# Patient Record
Sex: Male | Born: 1975 | Race: White | Hispanic: No | Marital: Married | Smoking: Never smoker
Health system: Southern US, Community
[De-identification: ages and names within clinical notes are randomized; demographics above are authoritative.]

## PROBLEM LIST (undated history)

## (undated) DIAGNOSIS — I10 Essential (primary) hypertension: Secondary | ICD-10-CM

## (undated) DIAGNOSIS — K219 Gastro-esophageal reflux disease without esophagitis: Secondary | ICD-10-CM

## (undated) DIAGNOSIS — T7840XA Allergy, unspecified, initial encounter: Secondary | ICD-10-CM

## (undated) DIAGNOSIS — G43909 Migraine, unspecified, not intractable, without status migrainosus: Secondary | ICD-10-CM

## (undated) HISTORY — DX: Essential (primary) hypertension: I10

## (undated) HISTORY — PX: WISDOM TOOTH EXTRACTION: SHX21

## (undated) HISTORY — DX: Gastro-esophageal reflux disease without esophagitis: K21.9

## (undated) HISTORY — DX: Allergy, unspecified, initial encounter: T78.40XA

## (undated) HISTORY — PX: UPPER GASTROINTESTINAL ENDOSCOPY: SHX188

## (undated) SURGERY — EGD (ESOPHAGOGASTRODUODENOSCOPY)
Anesthesia: Monitor Anesthesia Care

---

## 2010-09-20 ENCOUNTER — Emergency Department (HOSPITAL_COMMUNITY): Admission: EM | Admit: 2010-09-20 | Discharge: 2010-09-20 | Payer: Self-pay | Admitting: Emergency Medicine

## 2011-02-26 LAB — DIFFERENTIAL
Lymphocytes Relative: 14 % (ref 12–46)
Lymphs Abs: 1.2 10*3/uL (ref 0.7–4.0)
Monocytes Relative: 8 % (ref 3–12)
Neutro Abs: 6.8 10*3/uL (ref 1.7–7.7)
Neutrophils Relative %: 75 % (ref 43–77)

## 2011-02-26 LAB — URINALYSIS, ROUTINE W REFLEX MICROSCOPIC
Bilirubin Urine: NEGATIVE
Hgb urine dipstick: NEGATIVE
Specific Gravity, Urine: 1.012 (ref 1.005–1.030)
pH: 7.5 (ref 5.0–8.0)

## 2011-02-26 LAB — POCT I-STAT, CHEM 8
BUN: 13 mg/dL (ref 6–23)
Chloride: 103 mEq/L (ref 96–112)
Creatinine, Ser: 1.2 mg/dL (ref 0.4–1.5)
Glucose, Bld: 99 mg/dL (ref 70–99)
Potassium: 4.8 mEq/L (ref 3.5–5.1)
Sodium: 137 mEq/L (ref 135–145)

## 2011-02-26 LAB — CBC
Hemoglobin: 17.1 g/dL — ABNORMAL HIGH (ref 13.0–17.0)
MCH: 32.5 pg (ref 26.0–34.0)
RBC: 5.26 MIL/uL (ref 4.22–5.81)
WBC: 9 10*3/uL (ref 4.0–10.5)

## 2014-07-20 ENCOUNTER — Emergency Department (HOSPITAL_COMMUNITY)
Admission: EM | Admit: 2014-07-20 | Discharge: 2014-07-20 | Disposition: A | Discharge status: Home / Self Care | Payer: PRIVATE HEALTH INSURANCE | Attending: Emergency Medicine | Admitting: Emergency Medicine

## 2014-07-20 ENCOUNTER — Encounter (HOSPITAL_COMMUNITY): Payer: Self-pay | Admitting: Emergency Medicine

## 2014-07-20 DIAGNOSIS — IMO0002 Reserved for concepts with insufficient information to code with codable children: Secondary | ICD-10-CM | POA: Insufficient documentation

## 2014-07-20 DIAGNOSIS — Z23 Encounter for immunization: Secondary | ICD-10-CM | POA: Insufficient documentation

## 2014-07-20 DIAGNOSIS — Y9289 Other specified places as the place of occurrence of the external cause: Secondary | ICD-10-CM | POA: Insufficient documentation

## 2014-07-20 DIAGNOSIS — Y939 Activity, unspecified: Secondary | ICD-10-CM | POA: Insufficient documentation

## 2014-07-20 DIAGNOSIS — W540XXA Bitten by dog, initial encounter: Secondary | ICD-10-CM | POA: Insufficient documentation

## 2014-07-20 DIAGNOSIS — Z8669 Personal history of other diseases of the nervous system and sense organs: Secondary | ICD-10-CM | POA: Insufficient documentation

## 2014-07-20 HISTORY — DX: Migraine, unspecified, not intractable, without status migrainosus: G43.909

## 2014-07-20 MED ORDER — RABIES IMMUNE GLOBULIN 150 UNIT/ML IM INJ
20.0000 [IU]/kg | INJECTION | Freq: Once | INTRAMUSCULAR | Status: AC
  Administered 2014-07-20: 1575 [IU] via INTRAMUSCULAR
  Filled 2014-07-20: qty 10.5

## 2014-07-20 MED ORDER — TETANUS-DIPHTH-ACELL PERTUSSIS 5-2.5-18.5 LF-MCG/0.5 IM SUSP
0.5000 mL | Freq: Once | INTRAMUSCULAR | Status: AC
  Administered 2014-07-20: 0.5 mL via INTRAMUSCULAR
  Filled 2014-07-20: qty 0.5

## 2014-07-20 MED ORDER — RABIES VACCINE, PCEC IM SUSR
1.0000 mL | Freq: Once | INTRAMUSCULAR | Status: AC
  Administered 2014-07-20: 1 mL via INTRAMUSCULAR
  Filled 2014-07-20: qty 1

## 2014-07-20 NOTE — ED Notes (Signed)
Pt coming from home with a dog bite from June 29, 2014 with the recommendation that he should get a rabies vaccine since he was unable to track down the history of the dog that bit him.  Pt was bit on the outer aspect of the left leg.  The skin is intact, no bleeding.

## 2014-07-20 NOTE — ED Notes (Signed)
Dr. Fredderick PhenixBelfi at bedside assessing pt.

## 2014-07-20 NOTE — Discharge Instructions (Signed)
Human Bite  Human bite wounds tend to become infected, even when they seem minor at first. Bite wounds of the hand can be serious because the tendons and joints are close to the skin. Infection can develop very rapidly, even in a matter of hours.   DIAGNOSIS   Your caregiver will most likely:   Take a detailed history of the bite injury.   Perform a wound exam.   Take your medical history.  Blood tests or X-rays may be performed. Sometimes, infected bite wounds are cultured and sent to a lab to identify the infectious bacteria.  TREATMENT   Medical treatment will depend on the location of the bite as well as the patient's medical history. Treatment may include:   Wound care, such as cleaning and flushing the wound with saline solution, bandaging, and elevating the affected area.   Antibiotic medicine.   Tetanus immunization.   Leaving the wound open to heal. This is often done with human bites due to the high risk of infection. However, in certain cases, wound closure with stitches, wound adhesive, skin adhesive strips, or staples may be used.  Infected bites that are left untreated may require intravenous (IV) antibiotics and surgical treatment in the hospital.  HOME CARE INSTRUCTIONS   Follow your caregiver's instructions for wound care.   Take all medicines as directed.   If your caregiver prescribes antibiotics, take them as directed. Finish them even if you start to feel better.   Follow up with your caregiver for further exams or immunizations as directed.  You may need a tetanus shot if:   You cannot remember when you had your last tetanus shot.   You have never had a tetanus shot.   The injury broke your skin.  If you get a tetanus shot, your arm may swell, get red, and feel warm to the touch. This is common and not a problem. If you need a tetanus shot and you choose not to have one, there is a rare chance of getting tetanus. Sickness from tetanus can be serious.  SEEK IMMEDIATE MEDICAL CARE  IF:   You have increased pain, swelling, or redness around the bite wound.   You have chills.   You have a fever.   You have pus draining from the wound.   You have red streaks on the skin coming from the wound.   You have pain with movement or trouble moving the injured part.   You are not improving, or you are getting worse.   You have any other questions or concerns.  MAKE SURE YOU:   Understand these instructions.   Will watch your condition.   Will get help right away if you are not doing well or get worse.  Document Released: 01/07/2005 Document Revised: 02/22/2012 Document Reviewed: 07/22/2011  Exit[employer] Patient Information 2015 [employer]. This information is not intended to replace advice given to you by your health care provider. Make sure you discuss any questions you have with your health care provider.

## 2014-07-20 NOTE — ED Provider Notes (Signed)
CSN: [id number]     Arrival date & time 07/20/14  0810 History   First MD Initiated Contact with Patient 07/20/14 [phone]     Chief Complaint  Patient presents with  . Animal Bite     (Consider location/radiation/quality/duration/timing/severity/associated sxs/prior Treatment) HPI Comments: Patient presents for rabies vaccine. He states he was bitten by a dog on July 17. He does not know who the owner of the dog was, and animal control was unable to locate the dog. He states he remembered last night that animal control advised him to start rabies vaccines. He's never been vaccinated in the past. He says his dog bite as well healing. He denies any issues with that. He denies any cough shortness of breath or other recent illnesses. He's not sure when his last tetanus shot was.  Patient is a 38 y.o. male presenting with animal bite.  Animal Bite Associated symptoms: no fever and no numbness     Past Medical History  Diagnosis Date  . Migraines    Past Surgical History  Procedure Laterality Date  . Wisdom tooth extraction     No family history on file. History  Substance Use Topics  . Smoking status: Never Smoker   . Smokeless tobacco: Not on file  . Alcohol Use: Yes     Comment: occasional    Review of Systems  Constitutional: Negative for fever.  Respiratory: Negative for cough and shortness of breath.   Gastrointestinal: Negative for nausea and vomiting.  Musculoskeletal: Negative for arthralgias.  Skin: Positive for wound.  Neurological: Negative for weakness and numbness.      Allergies  Review of patient's allergies indicates no known allergies.  Home Medications   Prior to Admission medications   Medication Sig Start Date End Date Taking? Authorizing Provider  ibuprofen (ADVIL,MOTRIN) 200 MG tablet Take 400 mg by mouth every 6 (six) hours as needed for headache or moderate pain.   Yes Historical Provider, MD   Pulse 59  Temp(Src) 98 F (36.7 C)  Resp 17  Ht 5'  9" (1.753 m)  Wt 174 lb (78.926 kg)  BMI 25.68 kg/m2  SpO2 98% Physical Exam  Constitutional: He appears well-developed and well-nourished.  Cardiovascular: Normal rate.   Pulmonary/Chest: Effort normal.  Musculoskeletal:  Small healing abrasion to his left ankle. It's well healing without signs of infection. There is no underlying joint tenderness or bony tenderness.  NVI  Skin: Skin is warm and dry.    ED Course  Procedures (including critical care time) Labs Review Labs Reviewed - No data to display  Imaging Review No results found.   EKG Interpretation None      MDM   Final diagnoses:  Dog bite    Rabies vaccine series was started. His tetanus shot was updated. He is advised to return as needed for worsening symptoms. He's been encouraged to go to urgent care Center during daytime hours to have his rabies vaccines.    Rolan BuccoMelanie Tzipora Mcinroy, MD 07/20/14 [phone]

## 2014-07-23 ENCOUNTER — Encounter (HOSPITAL_COMMUNITY): Payer: Self-pay | Admitting: Emergency Medicine

## 2014-07-23 ENCOUNTER — Emergency Department (INDEPENDENT_AMBULATORY_CARE_PROVIDER_SITE_OTHER)
Admission: EM | Admit: 2014-07-23 | Discharge: 2014-07-23 | Disposition: A | Discharge status: Home / Self Care | Payer: PRIVATE HEALTH INSURANCE | Source: Home / Self Care

## 2014-07-23 DIAGNOSIS — Z203 Contact with and (suspected) exposure to rabies: Secondary | ICD-10-CM

## 2014-07-23 MED ORDER — RABIES VACCINE, PCEC IM SUSR
INTRAMUSCULAR | Status: AC
  Filled 2014-07-23: qty 1

## 2014-07-23 MED ORDER — RABIES VACCINE, PCEC IM SUSR
1.0000 mL | Freq: Once | INTRAMUSCULAR | Status: AC
  Administered 2014-07-23: 1 mL via INTRAMUSCULAR

## 2014-07-23 NOTE — Discharge Instructions (Signed)
Return as instructed by ed staff.  Return sooner if any concerns

## 2014-07-23 NOTE — ED Notes (Signed)
Patient bit by a dog on 7/17.  Patient does not know dog, owner, or shot history.  Seen in ed on 8/7 and rabies series initiated.  This is second rabies injection.  No complaints today.

## 2014-07-28 ENCOUNTER — Encounter (HOSPITAL_COMMUNITY): Payer: Self-pay | Admitting: Emergency Medicine

## 2014-07-28 ENCOUNTER — Emergency Department (INDEPENDENT_AMBULATORY_CARE_PROVIDER_SITE_OTHER)
Admission: EM | Admit: 2014-07-28 | Discharge: 2014-07-28 | Disposition: A | Discharge status: Home / Self Care | Payer: PRIVATE HEALTH INSURANCE | Source: Home / Self Care

## 2014-07-28 DIAGNOSIS — Z203 Contact with and (suspected) exposure to rabies: Secondary | ICD-10-CM

## 2014-07-28 MED ORDER — RABIES VACCINE, PCEC IM SUSR
INTRAMUSCULAR | Status: AC
  Filled 2014-07-28: qty 1

## 2014-07-28 MED ORDER — RABIES VACCINE, PCEC IM SUSR
1.0000 mL | Freq: Once | INTRAMUSCULAR | Status: AC
  Administered 2014-07-28: 1 mL via INTRAMUSCULAR

## 2014-07-28 NOTE — ED Notes (Signed)
Here for day #7 in series; to return in 1 week for next injection

## 2014-07-28 NOTE — Discharge Instructions (Signed)
Return to Urgent Care for remaining injections in the rabies series

## 2014-08-04 ENCOUNTER — Encounter (HOSPITAL_COMMUNITY): Payer: Self-pay | Admitting: Emergency Medicine

## 2014-08-04 ENCOUNTER — Emergency Department (INDEPENDENT_AMBULATORY_CARE_PROVIDER_SITE_OTHER)
Admission: EM | Admit: 2014-08-04 | Discharge: 2014-08-04 | Disposition: A | Discharge status: Home / Self Care | Payer: PRIVATE HEALTH INSURANCE | Source: Home / Self Care

## 2014-08-04 DIAGNOSIS — Z203 Contact with and (suspected) exposure to rabies: Secondary | ICD-10-CM

## 2014-08-04 MED ORDER — RABIES VACCINE, PCEC IM SUSR
1.0000 mL | Freq: Once | INTRAMUSCULAR | Status: AC
  Administered 2014-08-04: 1 mL via INTRAMUSCULAR

## 2014-08-04 MED ORDER — RABIES VACCINE, PCEC IM SUSR
INTRAMUSCULAR | Status: AC
  Filled 2014-08-04: qty 1

## 2014-08-04 NOTE — Discharge Instructions (Signed)
Follow up as needed

## 2014-08-04 NOTE — ED Notes (Signed)
Rabies injection 

## 2017-10-15 ENCOUNTER — Telehealth (HOSPITAL_COMMUNITY): Payer: Self-pay | Admitting: Vascular Surgery

## 2017-10-15 NOTE — Telephone Encounter (Signed)
Left pt message giving pt appt day and time w. Db, asked pt to call back to confirm

## 2017-10-18 ENCOUNTER — Ambulatory Visit (HOSPITAL_COMMUNITY)
Admission: RE | Admit: 2017-10-18 | Discharge: 2017-10-18 | Disposition: A | Discharge status: Home / Self Care | Payer: PRIVATE HEALTH INSURANCE | Source: Ambulatory Visit | Attending: Internal Medicine | Admitting: Internal Medicine

## 2017-10-18 VITALS — BP 130/94 | HR 61 | Wt 181.0 lb

## 2017-10-18 DIAGNOSIS — I493 Ventricular premature depolarization: Secondary | ICD-10-CM | POA: Insufficient documentation

## 2017-10-18 DIAGNOSIS — I10 Essential (primary) hypertension: Secondary | ICD-10-CM | POA: Diagnosis not present

## 2017-10-18 DIAGNOSIS — Z79899 Other long term (current) drug therapy: Secondary | ICD-10-CM | POA: Insufficient documentation

## 2017-10-18 LAB — URINALYSIS, ROUTINE W REFLEX MICROSCOPIC
Bilirubin Urine: NEGATIVE
GLUCOSE, UA: NEGATIVE mg/dL
Hgb urine dipstick: NEGATIVE
KETONES UR: NEGATIVE mg/dL
LEUKOCYTES UA: NEGATIVE
NITRITE: NEGATIVE
PROTEIN: NEGATIVE mg/dL
Specific Gravity, Urine: 1.009 (ref 1.005–1.030)
pH: 6 (ref 5.0–8.0)

## 2017-10-18 NOTE — Progress Notes (Signed)
CARDIOLOGY CLINIC CONSULT NOTE  Referring Physician: K. Reche Dixonalbot   HPI:  Brandon [name] is a 41 y/o C[employer] for [employer] with a h/o migraines and borderline HTN. Referred by Dr. Jeronimo GreavesKyle Talbot for further evaluation of his HTN.   In 2010 was told he had high blood pressure. He subsequently lost 30 pounds by mountain biking and running. BP got better but never got back to normal. Has very strong FHx of HTN in both parents.   Father has had extensive w/u for HTN but no clear cause.   Cycles very competitively and does endurance events with a high-level of performance over many hours. Sweat profusely. No CP or SOB. BPs have been running 145-150/85-90. Only takes BP about once a month. Not taking any meds currently. Rides 6-10 hours per week on the mountain bike.  Eats relatively healthy. Does not drink a lot of caffeine. Does not routinely take supplements or herbals. No snoring.      Review of Systems: [y] = yes, [ ]  = no   General: Weight gain [ ] ; Weight loss [ ] ; Anorexia [ ] ; Fatigue [ ] ; Fever [ ] ; Chills [ ] ; Weakness [ ]   Cardiac: Chest pain/pressure [ ] ; Resting SOB [ ] ; Exertional SOB [ ] ; Orthopnea [ ] ; Pedal Edema [ ] ; Palpitations [ ] ; Syncope [ ] ; Presyncope [ ] ; Paroxysmal nocturnal dyspnea[ ]   Pulmonary: Cough [ ] ; Wheezing[ ] ; Hemoptysis[ ] ; Sputum [ ] ; Snoring [ ]   GI: Vomiting[ ] ; Dysphagia[ ] ; Melena[ ] ; Hematochezia [ ] ; Heartburn[ ] ; Abdominal pain [ ] ; Constipation [ ] ; Diarrhea [ ] ; BRBPR [ ]   GU: Hematuria[ ] ; Dysuria [ ] ; Nocturia[ ]   Vascular: Pain in legs with walking [ ] ; Pain in feet with lying flat [ ] ; Non-healing sores [ ] ; Stroke [ ] ; TIA [ ] ; Slurred speech [ ] ;  Neuro: Headaches[ ] ; Vertigo[ ] ; Seizures[ ] ; Paresthesias[ ] ;Blurred vision [ ] ; Diplopia [ ] ; Vision changes [ ]   Ortho/Skin: Arthritis [ ] ; Joint pain [ ] ; Muscle pain [ ] ; Joint swelling [ ] ; Back Pain [ ] ; Rash [ ]   Psych: Depression[ ] ; Anxiety[ ]   Heme: Bleeding problems [ ] ; Clotting disorders  [ ] ; Anemia [ ]   Endocrine: Diabetes [ ] ; Thyroid dysfunction[ ]    Past Medical History:  Diagnosis Date  . Migraines     Current Outpatient Medications  Medication Sig Dispense Refill  . ibuprofen (ADVIL,MOTRIN) 200 MG tablet Take 400 mg by mouth every 6 (six) hours as needed for headache or moderate pain.     No current facility-administered medications for this encounter.     No Known Allergies    Social History   Socioeconomic History  . Marital status: Married    Spouse name: Not on file  . Number of children: Not on file  . Years of education: Not on file  . Highest education level: Not on file  Social Needs  . Financial resource strain: Not on file  . Food insecurity - worry: Not on file  . Food insecurity - inability: Not on file  . Transportation needs - medical: Not on file  . Transportation needs - non-medical: Not on file  Occupational History  . Not on file  Tobacco Use  . Smoking status: Never Smoker  Substance and Sexual Activity  . Alcohol use: Yes    Comment: occasional  . Drug use: No  . Sexual activity: Not on file  Other Topics Concern  . Not on file  Social History Narrative  . Not on file   FHX: Mom HTN Dad HTN and Afib/AFL s/p ablation Only child MGMs both with HTN MGF died MI  Vitals:   Oct 29, 2017 1212  BP: (!) 130/94  Pulse: 61  SpO2: 99%  Weight: 181 lb (82.1 kg)    PHYSICAL EXAM: General:  Fit appearing. No respiratory difficulty HEENT: normal Neck: supple. no JVD. Carotids 2+ bilat; no bruits. No lymphadenopathy or thryomegaly appreciated. Cor: PMI nondisplaced. Huston Foley regular . No rubs, gallops or murmurs. Lungs: clear Abdomen: soft, nontender, nondistended. No hepatosplenomegaly. No bruits or masses. Good bowel sounds. Extremities: no cyanosis, clubbing, rash, edema Neuro: alert & oriented x 3, cranial nerves grossly intact. moves all 4 extremities w/o difficulty. Affect pleasant.  ECG:  Sinus brady 41 with frequent  PVCs/junctional beats in a bigeminal pattern. 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 Have asked him to take his BP at least 2x/day at different times throughout the day (including post exercise) to trend BP.  - He will likely need anti-HTN agent. With high-level exercise performance and prolonged events will avoid b-blockers and diuretics - Likely will start losartan 25,mg daily depending on BP log - Diet and exercise already quite good - UA done in clinic without protein. - No evidence of OSA - No renal bruits on exam  2) Frequent PVCs on ECG - This is asymptomatic. Suspect he may have a junctional rhythm that competes with his slow sinus rate.  - Will check echo - I will d/w EP. May need 48-hour monitor.   Arvilla Meres, MD  8:59 PM

## 2017-10-18 NOTE — Patient Instructions (Signed)
Please keep a log of your blood pressure readings for 2 weeks and e-mail your findings to Dr.Bensimhon.  Follow up with Dr.Bensimhon in 3-4 months.

## 2017-12-23 ENCOUNTER — Telehealth (HOSPITAL_COMMUNITY): Payer: Self-pay | Admitting: *Deleted

## 2017-12-23 MED ORDER — LOSARTAN POTASSIUM 25 MG PO TABS: 25.0000 mg | ORAL_TABLET | Freq: Every day | ORAL | 3 refills | Status: DC

## 2017-12-23 NOTE — Telephone Encounter (Signed)
Pt emailed Dr Gala RomneyBensimhon his BP readings, per Dr Gala RomneyBensimhon start Losartan 25 mg daily, [facility]. That BP is too high. Let's start losartan 25 mg daily and see how it goes? Ok?   Brandon [name] - can you send script in for Brandon [name]?  Thanks. - dan     > On Dec 22, 2017, at 3:07 PM, [name]  wrote: >  > Hi Dan - Apparently I'm a terrible patient and an even worse BP logger. But, I was able to get the following measurements from different times of day and after a couple of tough workouts: >  > 11/6       10am     151/84 > 11/7         9am     159/91 > 11/8       8pm       142/90 > 11/9       10am     143/91 > 12/6       10am     159/90 > 12/7    8pm    162/97 (30 minutes post 1 hour long steady Zone 3 workout) > 12/9    9am    152/88 > 12/11    10am    155/92 > 12/18    12pm    151/91 > 12/19    3pm    150/90 > 12/19    9pm    164/96 (30 minutes post 45 minute Zone 5 intervals workout) > 12/31    10am    145/91 > 12/31    4pm    149/92

## 2018-12-28 ENCOUNTER — Other Ambulatory Visit (HOSPITAL_COMMUNITY): Payer: Self-pay | Admitting: Internal Medicine

## 2019-08-04 ENCOUNTER — Ambulatory Visit: Payer: PRIVATE HEALTH INSURANCE | Admitting: Family Medicine

## 2019-08-04 NOTE — Progress Notes (Deleted)
[name] [place], [place] [address] Phone: [phone] Subjective:    I'm seeing this patient by the request  of:  Brandon Ditch, MD   CC:   [id number]  Brandon [name] is a 43 y.o. male coming in with complaint of ***  Onset-  Location Duration-  Character- Aggravating factors- Reliving factors-  Therapies tried-  Severity-     Past Medical History:  Diagnosis Date  . Migraines    Past Surgical History:  Procedure Laterality Date  . WISDOM TOOTH EXTRACTION     Social History   Socioeconomic History  . Marital status: Married    Spouse name: Not on file  . Number of children: Not on file  . Years of education: Not on file  . Highest education level: Not on file  Occupational History  . Not on file  Social Needs  . Financial resource strain: Not on file  . Food insecurity    Worry: Not on file    Inability: Not on file  . Transportation needs    Medical: Not on file    Non-medical: Not on file  Tobacco Use  . Smoking status: Never Smoker  Substance and Sexual Activity  . Alcohol use: Yes    Comment: occasional  . Drug use: No  . Sexual activity: Not on file  Lifestyle  . Physical activity    Days per week: Not on file    Minutes per session: Not on file  . Stress: Not on file  Relationships  . Social Herbalist on phone: Not on file    Gets together: Not on file    Attends religious service: Not on file    Active member of club or organization: Not on file    Attends meetings of clubs or organizations: Not on file    Relationship status: Not on file  Other Topics Concern  . Not on file  Social History Narrative  . Not on file   No Known Allergies No family history on file.   Current Outpatient Medications (Cardiovascular):  .  losartan (COZAAR) 25 MG tablet, TAKE 1 TABLET BY MOUTH EVERY DAY   Current Outpatient Medications (Analgesics):  .  ibuprofen (ADVIL,MOTRIN) 200 MG  tablet, Take 400 mg by mouth every 6 (six) hours as needed for headache or moderate pain.      Past medical history, social, surgical and family history all reviewed in electronic medical record.  No pertanent information unless stated regarding to the chief complaint.   Review of Systems:  No headache, visual changes, nausea, vomiting, diarrhea, constipation, dizziness, abdominal pain, skin rash, fevers, chills, night sweats, weight loss, swollen lymph nodes, body aches, joint swelling, muscle aches, chest pain, shortness of breath, mood changes.   Objective  There were no vitals taken for this visit. Systems examined below as of    General: No apparent distress alert and oriented x3 mood and affect normal, dressed appropriately.  HEENT: Pupils equal, extraocular movements intact  Respiratory: Patient's speak in full sentences and does not appear short of breath  Cardiovascular: No lower extremity edema, non tender, no erythema  Skin: Warm dry intact with no signs of infection or rash on extremities or on axial skeleton.  Abdomen: Soft nontender  Neuro: Cranial nerves II through XII are intact, neurovascularly intact in all extremities with 2+ DTRs and 2+ pulses.  Lymph: No lymphadenopathy of posterior or anterior cervical chain or axillae  bilaterally.  Gait normal with good balance and coordination.  MSK:  Non tender with full range of motion and good stability and symmetric strength and tone of shoulders, elbows, wrist, hip, knee and ankles bilaterally.     Impression and Recommendations:     This case required medical decision making of moderate complexity. The above documentation has been reviewed and is accurate and complete Judi Saa, DO       Note: This dictation was prepared with Dragon dictation along with smaller phrase technology. Any transcriptional errors that result from this process are unintentional.

## 2019-08-07 NOTE — Progress Notes (Signed)
Brandon Brandon [name] [place], [place] [address] Phone: [phone] Subjective:   Brandon Brandon [name], am serving as a scribe for Brandon Brandon [name].  I'[name] seeing this patient by the request  of:  Brandon Ditch, MD   CC: Neck pain  [id number]  Brandon Brandon [name] is a 43 y.o. male coming in with complaint of neck and upper back pain. Pain for 9 months. Denies any radiating symptoms. Pain increases after mountain biking and occurs the next day. Is woken up in the middle of the night with pain. Left sided only. Does suffer from migraines.  Patient did have a history of a CT scan of the neck in 2011 that was independently visualized by me showing Brandon [name] significant bony abnormality.  Since then patient has not had any imaging.    Past Medical History:  Diagnosis Date  . Migraines    Past Surgical History:  Procedure Laterality Date  . WISDOM TOOTH EXTRACTION     Social History   Socioeconomic History  . Marital status: Married    Spouse name: Not on file  . Number of children: Not on file  . Years of education: Not on file  . Highest education level: Not on file  Occupational History  . Not on file  Social Needs  . Financial resource strain: Not on file  . Food insecurity    Worry: Not on file    Inability: Not on file  . Transportation needs    Medical: Not on file    Non-medical: Not on file  Tobacco Use  . Smoking status: Never Smoker  Substance and Sexual Activity  . Alcohol use: Yes    Comment: occasional  . Drug use: Brandon [name]  . Sexual activity: Not on file  Lifestyle  . Physical activity    Days per week: Not on file    Minutes per session: Not on file  . Stress: Not on file  Relationships  . Social Herbalist on phone: Not on file    Gets together: Not on file    Attends religious service: Not on file    Active member of club or organization: Not on file    Attends meetings of clubs or organizations: Not on file   Relationship status: Not on file  Other Topics Concern  . Not on file  Social History Narrative  . Not on file   Brandon [name] Known Allergies Brandon [name] family history on file.   Current Outpatient Medications (Cardiovascular):  .  losartan (COZAAR) 25 MG tablet, TAKE 1 TABLET BY MOUTH EVERY DAY   Current Outpatient Medications (Analgesics):  .  ibuprofen (ADVIL,MOTRIN) 200 MG tablet, Take 400 mg by mouth every 6 (six) hours as needed for headache or moderate pain.      Past medical history, social, surgical and family history all reviewed in electronic medical record.  Brandon [name] pertanent information unless stated regarding to the chief complaint.   Review of Systems:  Brandon [name] headache, visual changes, nausea, vomiting, diarrhea, constipation, dizziness, abdominal pain, skin rash, fevers, chills, night sweats, weight loss, swollen lymph nodes, body aches, joint swelling, muscle aches, chest pain, shortness of breath, mood changes.   Objective  There were Brandon [name] vitals taken for this visit. Systems examined below as of    General: Brandon [name] apparent distress alert and oriented x3 mood and affect normal, dressed appropriately.  HEENT: Pupils equal, extraocular movements intact  Respiratory: Patient's speak in full sentences and does  not appear short of breath  Cardiovascular: Brandon [name] lower extremity edema, non tender, Brandon [name] erythema  Skin: Warm dry intact with Brandon [name] signs of infection or rash on extremities or on axial skeleton.  Abdomen: Soft nontender  Neuro: Cranial nerves II through XII are intact, neurovascularly intact in all extremities with 2+ DTRs and 2+ pulses.  Lymph: Brandon [name] lymphadenopathy of posterior or anterior cervical chain or axillae bilaterally.  Gait normal with good balance and coordination.  MSK:  Non tender with full range of motion and good stability and symmetric strength and tone of shoulders, elbows, wrist, hip, knee and ankles bilaterally.  Patient is neck exam shows some mild loss of lordosis, patient  does have negative Spurling's sign.  5 out of 5 strength in the upper extremities bilaterally and deep tendon reflexes intact.  Mild decrease in sidebending to the right and left of 5 to 10 degrees bilaterally.  Patient does have some scapular dyskinesis noted with some inferior weakness of the scapulas bilaterally right greater than left  Osteopathic findings C7 flexed rotated and side bent left T5 extended rotated and side bent right inhaled rib      Impression and Recommendations:     This case required medical decision making of moderate complexity. The above documentation has been reviewed and is accurate and complete Brandon Brandon [name] , DO       Note: This dictation was prepared with Dragon dictation along with smaller phrase technology. Any transcriptional errors that result from this process are unintentional.

## 2019-08-08 ENCOUNTER — Ambulatory Visit (INDEPENDENT_AMBULATORY_CARE_PROVIDER_SITE_OTHER): Payer: BC Managed Care – PPO | Admitting: Family Medicine

## 2019-08-08 ENCOUNTER — Encounter: Payer: Self-pay | Admitting: Family Medicine

## 2019-08-08 ENCOUNTER — Ambulatory Visit (INDEPENDENT_AMBULATORY_CARE_PROVIDER_SITE_OTHER)
Admission: RE | Admit: 2019-08-08 | Discharge: 2019-08-08 | Disposition: A | Discharge status: Home / Self Care | Payer: BC Managed Care – PPO | Source: Ambulatory Visit | Attending: Family Medicine | Admitting: Family Medicine

## 2019-08-08 ENCOUNTER — Other Ambulatory Visit: Payer: Self-pay

## 2019-08-08 VITALS — BP 122/88 | HR 68 | Ht 69.0 in | Wt 186.0 lb

## 2019-08-08 DIAGNOSIS — M999 Biomechanical lesion, unspecified: Secondary | ICD-10-CM | POA: Diagnosis not present

## 2019-08-08 DIAGNOSIS — G2589 Other specified extrapyramidal and movement disorders: Secondary | ICD-10-CM | POA: Diagnosis not present

## 2019-08-08 DIAGNOSIS — M542 Cervicalgia: Secondary | ICD-10-CM

## 2019-08-08 MED ORDER — GABAPENTIN 100 MG PO CAPS: 200.0000 mg | ORAL_CAPSULE | Freq: Every day | ORAL | 0 refills | Status: DC

## 2019-08-08 NOTE — Assessment & Plan Note (Signed)
Decision today to treat with OMT was based on Physical Exam  After verbal consent patient was treated with HVLA, ME, FPR techniques in cervical, thoracic, rib areas  Patient tolerated the procedure well with improvement in symptoms  Patient given exercises, stretches and lifestyle modifications  See medications in patient instructions if given  Patient will follow up in 4-6 weeks 

## 2019-08-08 NOTE — Assessment & Plan Note (Signed)
Patient is hemodynamically hematemesis.  Discussed with patient in great length.  Patient will change position of implant, gabapentin and nitroglycerin, x-rays ordered today to further evaluate for any other bony abnormality that could be contributing.  Attempted osteopathic manipulation that I think will also be beneficial.  Patient will follow-up with me again in 4 to 6 weeks

## 2019-08-08 NOTE — Patient Instructions (Signed)
Good to see you.  Ice 20 minutes 2 times daily. Usually after activity and before bed. Exercises 3 times a week.  Gabapentin 200 mg as needed Raise handle bars on mountain bike Turmeric 500mg  daily  Vitamin D 2000 IU daily  See me again in 4-6 weeks

## 2019-09-08 ENCOUNTER — Ambulatory Visit (INDEPENDENT_AMBULATORY_CARE_PROVIDER_SITE_OTHER): Payer: BC Managed Care – PPO | Admitting: Family Medicine

## 2019-09-08 ENCOUNTER — Encounter: Payer: Self-pay | Admitting: Family Medicine

## 2019-09-08 ENCOUNTER — Other Ambulatory Visit: Payer: Self-pay

## 2019-09-08 VITALS — BP 118/82 | HR 63 | Ht 69.0 in | Wt 184.0 lb

## 2019-09-08 DIAGNOSIS — M999 Biomechanical lesion, unspecified: Secondary | ICD-10-CM

## 2019-09-08 DIAGNOSIS — G2589 Other specified extrapyramidal and movement disorders: Secondary | ICD-10-CM | POA: Diagnosis not present

## 2019-09-08 NOTE — Patient Instructions (Signed)
Try to go exercises Good luck on the mud  See me in 6 weeks

## 2019-09-08 NOTE — Assessment & Plan Note (Signed)
Continues to have some instability.  I believe the patient will do relatively well.  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  Patient follow-up with me again in 4 to 8 weeks.  Responds well to manipulation

## 2019-09-08 NOTE — Progress Notes (Signed)
[name] [place], [place] [address] Phone: [phone] Subjective:   Brandon [name], am serving as a scribe for Dr. Hulan Saas.    CC: Back pain follow-up  HMC:[id number]   08/08/2019 Patient is hemodynamically hematemesis.  Discussed with patient in great length.  Patient will change position of implant, gabapentin and nitroglycerin, x-rays ordered today to further evaluate for any other bony abnormality that could be contributing.  Attempted osteopathic manipulation that I think will also be beneficial.  Patient will follow-up with me again in 4 to 6 weeks  Update 09/08/2019 Brandon [name] is a 43 y.o. male coming in with complaint of upper back pain. Patient states that his back is improving slowly. Feels about the same as last visit. Is using supplements. Has been doing exercises.  Patient states that when he does more road biking he does not have as much pain as he does when he was mountain bikes. Patient did have x-rays of the neck at last exam.  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      Past Medical History:  Diagnosis Date  . Migraines    Past Surgical History:  Procedure Laterality Date  . WISDOM TOOTH EXTRACTION     Social History   Socioeconomic History  . Marital status: Married    Spouse name: Not on file  . Number of children: Not on file  . Years of education: Not on file  . Highest education level: Not on file  Occupational History  . Not on file  Social Needs  . Financial resource strain: Not on file  . Food insecurity    Worry: Not on file    Inability: Not on file  . Transportation needs    Medical: Not on file    Non-medical: Not on file  Tobacco Use  . Smoking status: Never Smoker  Substance and Sexual Activity  . Alcohol use: Yes    Comment: occasional  . Drug use: [name]  . Sexual activity: Not on file   Lifestyle  . Physical activity    Days per week: Not on file    Minutes per session: Not on file  . Stress: Not on file  Relationships  . Social Herbalist on phone: Not on file    Gets together: Not on file    Attends religious service: Not on file    Active member of club or organization: Not on file    Attends meetings of clubs or organizations: Not on file    Relationship status: Not on file  Other Topics Concern  . Not on file  Social History Narrative  . Not on file   [name] Known Allergies [name] family history on file.   Current Outpatient Medications (Cardiovascular):  .  losartan (COZAAR) 25 MG tablet, TAKE 1 TABLET BY MOUTH EVERY DAY   Current Outpatient Medications (Analgesics):  .  ibuprofen (ADVIL,MOTRIN) 200 MG tablet, Take 400 mg by mouth every 6 (six) hours as needed for headache or moderate pain.   Current Outpatient Medications (Other):  .  gabapentin (NEURONTIN) 100 MG capsule, Take 2 capsules (200 mg total) by mouth at bedtime.    Past medical history, social, surgical and family history all reviewed in electronic medical record.  [name] pertanent information unless stated regarding to the chief complaint.   Review of Systems:  [name] headache, visual changes, nausea,  vomiting, diarrhea, constipation, dizziness, abdominal pain, skin rash, fevers, chills, night sweats, weight loss, swollen lymph nodes, body aches, joint swelling, muscle aches, chest pain, shortness of breath, mood changes.   Objective  Blood pressure 118/82, pulse 63, height 5\' 9"  (1.753 m), weight 184 lb (83.5 kg), SpO2 98 %.     General: [name] apparent distress alert and oriented x3 mood and affect normal, dressed appropriately.  HEENT: Pupils equal, extraocular movements intact  Respiratory: Patient's speak in full sentences and does not appear short of breath  Cardiovascular: [name] lower extremity edema, non tender, [name] erythema  Skin: Warm dry intact with [name] signs of infection or rash on  extremities or on axial skeleton.  Abdomen: Soft nontender  Neuro: Cranial nerves II through XII are intact, neurovascularly intact in all extremities with 2+ DTRs and 2+ pulses.  Lymph: [name] lymphadenopathy of posterior or anterior cervical chain or axillae bilaterally.  Gait normal with good balance and coordination.  MSK:  Non tender with full range of motion and good stability and symmetric strength and tone of shoulders, elbows, wrist, hip, knee and ankles bilaterally.  Neck exam shows the patient does have some mild loss of flexion extension 5 to 10 degrees.  Patient does have some tightness in the paraspinal musculature of the cervical spine.  Mild decrease in sidebending to the right as well.  Osteopathic findings C4 flexed rotated and side bent left C6 flexed rotated and side bent left T3 extended rotated and side bent right inhaled third rib     Impression and Recommendations:     This case required medical decision making of moderate complexity. The above documentation has been reviewed and is accurate and complete , DO       Note: This dictation was prepared with Dragon dictation along with smaller phrase technology. Any transcriptional errors that result from this process are unintentional.

## 2019-09-08 NOTE — Assessment & Plan Note (Signed)
Decision today to treat with OMT was based on Physical Exam  After verbal consent patient was treated with HVLA, ME, FPR techniques in cervical, thoracic, lumbar and sacral areas  Patient tolerated the procedure well with improvement in symptoms  Patient given exercises, stretches and lifestyle modifications  See medications in patient instructions if given  Patient will follow up in 4-8 weeks 

## 2019-10-18 ENCOUNTER — Encounter: Payer: Self-pay | Admitting: Family Medicine

## 2019-10-18 ENCOUNTER — Ambulatory Visit (INDEPENDENT_AMBULATORY_CARE_PROVIDER_SITE_OTHER): Payer: BC Managed Care – PPO | Admitting: Family Medicine

## 2019-10-18 VITALS — BP 128/84 | HR 49 | Ht 69.0 in | Wt 184.0 lb

## 2019-10-18 DIAGNOSIS — M999 Biomechanical lesion, unspecified: Secondary | ICD-10-CM

## 2019-10-18 DIAGNOSIS — G2589 Other specified extrapyramidal and movement disorders: Secondary | ICD-10-CM

## 2019-10-18 NOTE — Patient Instructions (Signed)
See me again in 8-10 weeks Limit mtb to 5 hours

## 2019-10-18 NOTE — Assessment & Plan Note (Signed)
Decision today to treat with OMT was based on Physical Exam  After verbal consent patient was treated with HVLA, ME, FPR techniques in cervical, thoracic, rib areas  Patient tolerated the procedure well with improvement in symptoms  Patient given exercises, stretches and lifestyle modifications  See medications in patient instructions if given  Patient will follow up in 8-12 weeks 

## 2019-10-18 NOTE — Assessment & Plan Note (Signed)
Scapular dyskinesis still noted but is not making significant improvement.  Patient is having very minimal pain but is not doing the activities that seem to exacerbate it as much previously.  Discussed posture and ergonomics, discussed which activities to do which wants to avoid.  Patient will increase activity slowly over the course the next several weeks again.  Follow-up with me again in 8 to 12 weeks.

## 2019-10-18 NOTE — Progress Notes (Signed)
[name] [address] [place], [place] [address] Phone: [phone] Subjective:   Brandon [name], am serving as a scribe for Dr. Antoine Primas.   CC: neck and. Back pain follow-up  BJY:[id number]  Brandon [name] is a 43 y.o. male coming in with complaint of neck pain. Last seen on 09/08/2019 for OMT. Patient states that he has been having some stiffness in neck but that overall he is doing much better.  Neck and back pain.     Past Medical History:  Diagnosis Date  . Migraines    Past Surgical History:  Procedure Laterality Date  . WISDOM TOOTH EXTRACTION     Social History   Socioeconomic History  . Marital status: Married    Spouse name: Not on file  . Number of children: Not on file  . Years of education: Not on file  . Highest education level: Not on file  Occupational History  . Not on file  Social Needs  . Financial resource strain: Not on file  . Food insecurity    Worry: Not on file    Inability: Not on file  . Transportation needs    Medical: Not on file    Non-medical: Not on file  Tobacco Use  . Smoking status: Never Smoker  Substance and Sexual Activity  . Alcohol use: Yes    Comment: occasional  . Drug use: No  . Sexual activity: Not on file  Lifestyle  . Physical activity    Days per week: Not on file    Minutes per session: Not on file  . Stress: Not on file  Relationships  . Social Musician on phone: Not on file    Gets together: Not on file    Attends religious service: Not on file    Active member of club or organization: Not on file    Attends meetings of clubs or organizations: Not on file    Relationship status: Not on file  Other Topics Concern  . Not on file  Social History Narrative  . Not on file   No Known Allergies No family history on file.   Current Outpatient Medications (Cardiovascular):  .  losartan (COZAAR) 25 MG tablet, TAKE 1 TABLET BY MOUTH EVERY DAY   Current  Outpatient Medications (Analgesics):  .  ibuprofen (ADVIL,MOTRIN) 200 MG tablet, Take 400 mg by mouth every 6 (six) hours as needed for headache or moderate pain.   Current Outpatient Medications (Other):  .  gabapentin (NEURONTIN) 100 MG capsule, Take 2 capsules (200 mg total) by mouth at bedtime.    Past medical history, social, surgical and family history all reviewed in electronic medical record.  No pertanent information unless stated regarding to the chief complaint.   Review of Systems:  No headache, visual changes, nausea, vomiting, diarrhea, constipation, dizziness, abdominal pain, skin rash, fevers, chills, night sweats, weight loss, swollen lymph nodes, body aches, joint swelling,  chest pain, shortness of breath, mood changes.  Positive muscle aches  Objective  Blood pressure 128/84, pulse (!) 49, height 5\' 9"  (1.753 m), weight 184 lb (83.5 kg), SpO2 99 %.    General: No apparent distress alert and oriented x3 mood and affect normal, dressed appropriately.  HEENT: Pupils equal, extraocular movements intact  Respiratory: Patient's speak in full sentences and does not appear short of breath  Cardiovascular: No lower extremity edema, non tender, no erythema  Skin: Warm dry intact with no signs  of infection or rash on extremities or on axial skeleton.  Abdomen: Soft nontender  Neuro: Cranial nerves II through XII are intact, neurovascularly intact in all extremities with 2+ DTRs and 2+ pulses.  Lymph: No lymphadenopathy of posterior or anterior cervical chain or axillae bilaterally.  Gait antalgic gait MSK:  tender with limited range of motion and good stability and symmetric strength and tone of shoulders, elbows, wrist, hip, knee and ankles bilaterally.   Neck exam shows the patient still has some tightness in the parascapular region bilaterally right greater than left.  Patient does have some mild lack of last 5 degrees of extension of the neck as well.  Negative Spurling's  though noted.  Full range of motion of the shoulders bilaterally with full strength.  Osteopathic findings  C7 flexed rotated and side bent left T3 extended rotated and side bent right inhaled third rib T9 extended rotated and side bent left L2 flexed rotated and side bent right Sacrum right on right      Impression and Recommendations:     This case required medical decision making of moderate complexity. The above documentation has been reviewed and is accurate and complete Lyndal Pulley, DO       Note: This dictation was prepared with Dragon dictation along with smaller phrase technology. Any transcriptional errors that result from this process are unintentional.

## 2019-12-05 ENCOUNTER — Other Ambulatory Visit: Payer: BC Managed Care – PPO

## 2019-12-06 ENCOUNTER — Other Ambulatory Visit: Payer: BC Managed Care – PPO

## 2019-12-18 ENCOUNTER — Ambulatory Visit: Payer: BC Managed Care – PPO | Admitting: Family Medicine

## 2020-01-31 ENCOUNTER — Other Ambulatory Visit (HOSPITAL_COMMUNITY): Payer: Self-pay | Admitting: Internal Medicine

## 2020-03-05 ENCOUNTER — Other Ambulatory Visit (HOSPITAL_COMMUNITY): Payer: Self-pay | Admitting: Internal Medicine

## 2020-03-06 NOTE — Telephone Encounter (Signed)
Please contact this patient for a f/u with db. Patient hasn't been seen since 2018. Also let patient know he must keep that appointment to continue getting refills

## 2020-04-19 ENCOUNTER — Encounter (HOSPITAL_COMMUNITY): Payer: BC Managed Care – PPO | Admitting: Internal Medicine

## 2020-04-22 ENCOUNTER — Ambulatory Visit (HOSPITAL_COMMUNITY)
Admission: RE | Admit: 2020-04-22 | Discharge: 2020-04-22 | Disposition: A | Discharge status: Home / Self Care | Payer: BC Managed Care – PPO | Source: Ambulatory Visit | Attending: Internal Medicine | Admitting: Internal Medicine

## 2020-04-22 ENCOUNTER — Other Ambulatory Visit: Payer: Self-pay

## 2020-04-22 ENCOUNTER — Encounter (HOSPITAL_COMMUNITY): Payer: Self-pay | Admitting: Internal Medicine

## 2020-04-22 VITALS — BP 130/86 | HR 52 | Wt 183.0 lb

## 2020-04-22 DIAGNOSIS — I10 Essential (primary) hypertension: Secondary | ICD-10-CM | POA: Insufficient documentation

## 2020-04-22 DIAGNOSIS — Z136 Encounter for screening for cardiovascular disorders: Secondary | ICD-10-CM

## 2020-04-22 DIAGNOSIS — Z8249 Family history of ischemic heart disease and other diseases of the circulatory system: Secondary | ICD-10-CM | POA: Insufficient documentation

## 2020-04-22 DIAGNOSIS — I493 Ventricular premature depolarization: Secondary | ICD-10-CM | POA: Diagnosis not present

## 2020-04-22 DIAGNOSIS — Z79899 Other long term (current) drug therapy: Secondary | ICD-10-CM | POA: Diagnosis not present

## 2020-04-22 MED ORDER — LOSARTAN POTASSIUM 25 MG PO TABS: 25.0000 mg | ORAL_TABLET | Freq: Every day | ORAL | 3 refills | Status: DC

## 2020-04-22 NOTE — Progress Notes (Signed)
CARDIOLOGY CLINIC NOTE  Referring Physician: K. 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 Referred by Dr. Abigail Miyamoto for further evaluation of his HTN.   In 2010 was told he had high blood pressure. He subsequently lost 30 pounds by mountain biking and running. BP got better but never got back to normal. Has very strong FHx of HTN in both parents.   Father has had extensive w/u for HTN but no clear cause.   Here for f/u. Riding 10 hours per week and doing weights. Shireen Quan is very good. No undue SOB. No edema. Remains on losartan 25. SBP - well controlled - mostly in 120s.   Past Medical History:  Diagnosis Date  . Migraines     Current Outpatient Medications  Medication Sig Dispense Refill  . ibuprofen (ADVIL,MOTRIN) 200 MG tablet Take 400 mg by mouth every 6 (six) hours as needed for headache or moderate pain.    Marland Kitchen losartan (COZAAR) 25 MG tablet TAKE 1 TABLET BY MOUTH EVERY DAY. make appt FOR future refills 30 tablet 0   No current facility-administered medications for this encounter.    No Known Allergies    Social History   Socioeconomic History  . Marital status: Married    Spouse name: Not on file  . Number of children: Not on file  . Years of education: Not on file  . Highest education level: Not on file  Occupational History  . Not on file  Tobacco Use  . Smoking status: Never Smoker  . Smokeless tobacco: Never Used  Substance and Sexual Activity  . Alcohol use: Yes    Comment: occasional  . Drug use: No  . Sexual activity: Not on file  Other Topics Concern  . Not on file  Social History Narrative  . Not on file   Social Determinants of Health   Financial Resource Strain:   . Difficulty of Paying Living Expenses:   Food Insecurity:   . Worried About Charity fundraiser in the Last Year:   . Arboriculturist in the Last Year:   Transportation Needs:   . Film/video editor (Medical):     Marland Kitchen Lack of Transportation (Non-Medical):   Physical Activity:   . Days of Exercise per Week:   . Minutes of Exercise per Session:   Stress:   . Feeling of Stress :   Social Connections:   . Frequency of Communication with Friends and Family:   . Frequency of Social Gatherings with Friends and Family:   . Attends Religious Services:   . Active Member of Clubs or Organizations:   . Attends Archivist Meetings:   Marland Kitchen Marital Status:   Intimate Partner Violence:   . Fear of Current or Ex-Partner:   . Emotionally Abused:   Marland Kitchen Physically Abused:   . Sexually Abused:    FHX: Mom HTN Dad HTN and Afib/AFL s/p ablation Only child MGMs both with HTN MGF died MI  Vitals:   05-09-2020 1537  BP: 130/86  Pulse: (!) 52  SpO2: 98%  Weight: 83 kg (183 lb)    PHYSICAL EXAM: General:  Fit appearing. No respiratory difficulty HEENT: normal Neck: supple. no JVD. Carotids 2+ bilat; no bruits. No lymphadenopathy or thryomegaly appreciated. Cor: PMI nondisplaced. Regular rate & rhythm. No rubs, gallops or murmurs. Lungs: clear Abdomen: soft, nontender, nondistended. No hepatosplenomegaly. No bruits or masses. Good bowel  sounds. Extremities: no cyanosis, clubbing, rash, edema Neuro: alert & orientedx3, cranial nerves grossly intact. moves all 4 extremities w/o difficulty. Affect pleasant   ECG:  Sinus brady 52 LVH with early repol Personally reviewed  ASSESSMENT & PLAN:  1) HTN  - BP improved with losartan 25 daily and decreased stress  2) Frequent PVCs on ECG - decreased with less stress. - will check echo  3) Cardiac risk stratification - check calcium score.   Arvilla Meres, MD  3:45 PM

## 2020-04-22 NOTE — Patient Instructions (Addendum)
No blood work done today.  No medication changes were made. Please continue all current medications as prescribed.   Your physician recommends that you schedule a follow-up appointment in: 1 year. We will contact you to schedule an appointment.  Your physician has requested that you have an echocardiogram. Echocardiography is a painless test that uses sound waves to create images of your heart. It provides your doctor with information about the size and shape of your heart and how well your heart's chambers and valves are working. This procedure takes approximately one hour. There are no restrictions for this procedure.  Your provider has requested that you have a calcium score CT. This is done at [facility] and cost $150.00 out of pocket. Our office will contact you at a later date to schedule this appointment.   At the [facility], you and your health needs are our priority. As part of our continuing mission to provide you with exceptional heart care, we have created designated Provider Care Teams. These Care Teams include your primary Cardiologist (physician) and Advanced Practice Providers (APPs- Physician Assistants and Nurse Practitioners) who all work together to provide you with the care you need, when you need it.   You may see any of the following providers on your designated Care Team at your next follow up: Brandon [name] Kitchen Dr Brandon [name] . Dr Brandon [name] . Brandon Becket, NP . Brandon Lis, PA . Brandon [name], PharmD   Please be sure to bring in all your medications bottles to every appointment.

## 2020-04-22 NOTE — Addendum Note (Signed)
Encounter addended by: Chinita Pester, CMA on: 04/22/2020 4:30 PM  Actions taken: Order list changed

## 2020-04-22 NOTE — Addendum Note (Signed)
Encounter addended by: Chinita Pester, CMA on: 04/22/2020 4:15 PM  Actions taken: Clinical Note Signed, Order list changed, Diagnosis association updated

## 2020-04-22 NOTE — Addendum Note (Signed)
Encounter addended by: Chinita Pester, CMA on: 04/22/2020 4:19 PM  Actions taken: Order list changed, Diagnosis association updated

## 2020-05-16 ENCOUNTER — Other Ambulatory Visit (HOSPITAL_COMMUNITY): Payer: BC Managed Care – PPO

## 2020-05-16 ENCOUNTER — Ambulatory Visit (HOSPITAL_COMMUNITY)
Admission: RE | Admit: 2020-05-16 | Discharge: 2020-05-16 | Disposition: A | Discharge status: Home / Self Care | Payer: BC Managed Care – PPO | Source: Ambulatory Visit | Attending: Internal Medicine | Admitting: Internal Medicine

## 2020-05-16 ENCOUNTER — Other Ambulatory Visit: Payer: Self-pay

## 2020-05-16 DIAGNOSIS — I509 Heart failure, unspecified: Secondary | ICD-10-CM | POA: Diagnosis not present

## 2020-05-16 DIAGNOSIS — I110 Hypertensive heart disease with heart failure: Secondary | ICD-10-CM | POA: Insufficient documentation

## 2020-05-16 DIAGNOSIS — I10 Essential (primary) hypertension: Secondary | ICD-10-CM | POA: Diagnosis present

## 2020-05-16 DIAGNOSIS — I358 Other nonrheumatic aortic valve disorders: Secondary | ICD-10-CM | POA: Insufficient documentation

## 2020-05-16 NOTE — Progress Notes (Signed)
  Echocardiogram 2D Echocardiogram has been performed.  Brandon [name] 05/16/2020, 9:33 AM

## 2020-07-14 DIAGNOSIS — K222 Esophageal obstruction: Secondary | ICD-10-CM

## 2020-07-14 DIAGNOSIS — R4702 Dysphasia: Secondary | ICD-10-CM

## 2020-07-14 HISTORY — DX: Dysphasia: R47.02

## 2020-07-14 HISTORY — DX: Esophageal obstruction: K22.2

## 2020-07-25 ENCOUNTER — Other Ambulatory Visit: Payer: Self-pay

## 2020-07-25 ENCOUNTER — Encounter (HOSPITAL_COMMUNITY): Payer: Self-pay | Admitting: Emergency Medicine

## 2020-07-25 ENCOUNTER — Ambulatory Visit (HOSPITAL_COMMUNITY)
Admission: EM | Admit: 2020-07-25 | Discharge: 2020-07-25 | Disposition: A | Discharge status: Home / Self Care | Payer: BC Managed Care – PPO | Attending: Emergency Medicine | Admitting: Emergency Medicine

## 2020-07-25 ENCOUNTER — Emergency Department (HOSPITAL_COMMUNITY): Payer: BC Managed Care – PPO | Admitting: Anesthesiology

## 2020-07-25 ENCOUNTER — Encounter (HOSPITAL_COMMUNITY)
Admission: EM | Disposition: A | Discharge status: Home / Self Care | Payer: Self-pay | Source: Home / Self Care | Attending: Emergency Medicine

## 2020-07-25 DIAGNOSIS — Z880 Allergy status to penicillin: Secondary | ICD-10-CM | POA: Insufficient documentation

## 2020-07-25 DIAGNOSIS — T18128A Food in esophagus causing other injury, initial encounter: Secondary | ICD-10-CM | POA: Insufficient documentation

## 2020-07-25 DIAGNOSIS — K2970 Gastritis, unspecified, without bleeding: Secondary | ICD-10-CM | POA: Insufficient documentation

## 2020-07-25 DIAGNOSIS — Z20822 Contact with and (suspected) exposure to covid-19: Secondary | ICD-10-CM | POA: Diagnosis not present

## 2020-07-25 DIAGNOSIS — K209 Esophagitis, unspecified without bleeding: Secondary | ICD-10-CM | POA: Insufficient documentation

## 2020-07-25 DIAGNOSIS — Z79899 Other long term (current) drug therapy: Secondary | ICD-10-CM | POA: Insufficient documentation

## 2020-07-25 DIAGNOSIS — X58XXXA Exposure to other specified factors, initial encounter: Secondary | ICD-10-CM | POA: Diagnosis not present

## 2020-07-25 DIAGNOSIS — K222 Esophageal obstruction: Secondary | ICD-10-CM | POA: Insufficient documentation

## 2020-07-25 DIAGNOSIS — R1310 Dysphagia, unspecified: Secondary | ICD-10-CM

## 2020-07-25 DIAGNOSIS — I10 Essential (primary) hypertension: Secondary | ICD-10-CM | POA: Diagnosis not present

## 2020-07-25 HISTORY — PX: BIOPSY: SHX5522

## 2020-07-25 HISTORY — PX: ESOPHAGOGASTRODUODENOSCOPY (EGD) WITH PROPOFOL: SHX5813

## 2020-07-25 HISTORY — PX: FOREIGN BODY REMOVAL: SHX962

## 2020-07-25 LAB — SARS CORONAVIRUS 2 BY RT PCR (HOSPITAL ORDER, PERFORMED IN ~~LOC~~ HOSPITAL LAB): SARS Coronavirus 2: NEGATIVE

## 2020-07-25 SURGERY — EGD (ESOPHAGOGASTRODUODENOSCOPY)
Anesthesia: Monitor Anesthesia Care

## 2020-07-25 SURGERY — ESOPHAGOGASTRODUODENOSCOPY (EGD) WITH PROPOFOL
Anesthesia: General

## 2020-07-25 MED ORDER — OMEPRAZOLE 40 MG PO CPDR: 40.0000 mg | DELAYED_RELEASE_CAPSULE | Freq: Two times a day (BID) | ORAL | 5 refills | Status: DC

## 2020-07-25 MED ORDER — LIDOCAINE 2% (20 MG/ML) 5 ML SYRINGE
INTRAMUSCULAR | Status: DC | PRN
  Administered 2020-07-25: 60 mg via INTRAVENOUS

## 2020-07-25 MED ORDER — MIDAZOLAM HCL 2 MG/2ML IJ SOLN
INTRAMUSCULAR | Status: AC
  Filled 2020-07-25: qty 2

## 2020-07-25 MED ORDER — SUCCINYLCHOLINE CHLORIDE 20 MG/ML IJ SOLN
INTRAMUSCULAR | Status: DC | PRN
  Administered 2020-07-25: 100 mg via INTRAVENOUS

## 2020-07-25 MED ORDER — FENTANYL CITRATE (PF) 100 MCG/2ML IJ SOLN
INTRAMUSCULAR | Status: AC
  Filled 2020-07-25: qty 2

## 2020-07-25 MED ORDER — FENTANYL CITRATE (PF) 100 MCG/2ML IJ SOLN
INTRAMUSCULAR | Status: DC | PRN
  Administered 2020-07-25: 50 ug via INTRAVENOUS

## 2020-07-25 MED ORDER — LACTATED RINGERS IV SOLN: INTRAVENOUS | Status: DC | PRN

## 2020-07-25 MED ORDER — PROPOFOL 10 MG/ML IV BOLUS
INTRAVENOUS | Status: DC | PRN
  Administered 2020-07-25: 200 mg via INTRAVENOUS

## 2020-07-25 MED ORDER — SODIUM CHLORIDE 0.9 % IV SOLN: INTRAVENOUS | Status: DC

## 2020-07-25 MED ORDER — PROPOFOL 10 MG/ML IV BOLUS
INTRAVENOUS | Status: AC
  Filled 2020-07-25: qty 20

## 2020-07-25 MED ORDER — DEXAMETHASONE SODIUM PHOSPHATE 10 MG/ML IJ SOLN
INTRAMUSCULAR | Status: DC | PRN
  Administered 2020-07-25: 10 mg via INTRAVENOUS

## 2020-07-25 MED ORDER — GLUCAGON HCL RDNA (DIAGNOSTIC) 1 MG IJ SOLR
1.0000 mg | Freq: Once | INTRAMUSCULAR | Status: AC
  Administered 2020-07-25: 1 mg via INTRAVENOUS
  Filled 2020-07-25: qty 1

## 2020-07-25 MED ORDER — ONDANSETRON HCL 4 MG/2ML IJ SOLN
INTRAMUSCULAR | Status: DC | PRN
  Administered 2020-07-25: 4 mg via INTRAVENOUS

## 2020-07-25 MED ORDER — FENTANYL CITRATE (PF) 100 MCG/2ML IJ SOLN: 25.0000 ug | INTRAMUSCULAR | Status: DC | PRN

## 2020-07-25 MED ORDER — MIDAZOLAM HCL 5 MG/5ML IJ SOLN
INTRAMUSCULAR | Status: DC | PRN
  Administered 2020-07-25: 1 mg via INTRAVENOUS

## 2020-07-25 SURGICAL SUPPLY — 14 items

## 2020-07-25 NOTE — ED Provider Notes (Signed)
[facility]-EMERGENCY DEPT Provider Note   CSN: [id number] Arrival date & time: 07/25/20  1256     History Chief Complaint  Patient presents with  . Food stuck in throat    Brandon [name] is a 44 y.o. male.  Patient presents with food impaction.  Ate steak last night and has not been able to tolerate p.o. since.  Vomits whenever he tries to drink.  Otherwise has no respiratory symptoms.  States that this has happened before in the past but resolved on its own after a minute or 2.  This occurred about 15 hours ago.  The history is provided by the patient.  Illness Severity:  Mild Onset quality:  Gradual Timing:  Constant Progression:  Unchanged Chronicity:  New Relieved by:  Nothing Worsened by:  Nothing Ineffective treatments:  Carbonated drinks Associated symptoms: vomiting   Associated symptoms: no abdominal pain, no chest pain, no congestion, no cough, no fever, no nausea, no shortness of breath and no sore throat        Past Medical History:  Diagnosis Date  . Migraines     Patient Active Problem List   Diagnosis Date Noted  . Scapular dyskinesis 08/08/2019  . Nonallopathic lesion of cervical region 08/08/2019  . Nonallopathic lesion of thoracic region 08/08/2019  . Nonallopathic lesion of rib cage 08/08/2019    Past Surgical History:  Procedure Laterality Date  . WISDOM TOOTH EXTRACTION         History reviewed. No pertinent family history.  Social History   Tobacco Use  . Smoking status: Never Smoker  . Smokeless tobacco: Never Used  Substance Use Topics  . Alcohol use: Yes    Comment: occasional  . Drug use: No    Home Medications Prior to Admission medications   Medication Sig Start Date End Date Taking? Authorizing Provider  ibuprofen (ADVIL,MOTRIN) 200 MG tablet Take 400 mg by mouth every 6 (six) hours as needed for headache or moderate pain.    [provider]  losartan (COZAAR) 25 MG tablet Take 1 tablet  (25 mg total) by mouth daily. 04/22/20   Bensimhon, Bevelyn Buckles, MD    Allergies    Penicillins  Review of Systems   Review of Systems  Constitutional: Negative for fever.  HENT: Negative for congestion and sore throat.   Respiratory: Negative for cough and shortness of breath.   Cardiovascular: Negative for chest pain.  Gastrointestinal: Positive for vomiting. Negative for abdominal pain and nausea.    Physical Exam Updated Vital Signs  ED Triage Vitals  Enc Vitals Group     BP 07/25/20 1304 (!) 154/107     Pulse Rate 07/25/20 1304 61     Resp 07/25/20 1304 18     Temp 07/25/20 1304 98.3 F (36.8 C)     Temp Source 07/25/20 1304 Oral     SpO2 07/25/20 1304 96 %     Weight 07/25/20 1306 175 lb (79.4 kg)     Height 07/25/20 1306 5\' 10"  (1.778 m)     Head Circumference --      Peak Flow --      Pain Score 07/25/20 1318 8     Pain Loc --      Pain Edu? --      Excl. in GC? --     Physical Exam Vitals and nursing note reviewed.  Constitutional:      Appearance: He is well-developed.  HENT:     Head: Normocephalic and  atraumatic.  Eyes:     Conjunctiva/sclera: Conjunctivae normal.  Cardiovascular:     Rate and Rhythm: Normal rate and regular rhythm.     Pulses: Normal pulses.     Heart sounds: No murmur heard.   Pulmonary:     Effort: Pulmonary effort is normal. No respiratory distress.     Breath sounds: Normal breath sounds.  Abdominal:     Palpations: Abdomen is soft.     Tenderness: There is no abdominal tenderness.  Musculoskeletal:     Cervical back: Neck supple.  Skin:    General: Skin is warm and dry.  Neurological:     Mental Status: He is alert.     ED Results / Procedures / Treatments   Labs (all labs ordered are listed, but only abnormal results are displayed) Labs Reviewed  SARS CORONAVIRUS 2 BY RT PCR (HOSPITAL ORDER, PERFORMED IN [facility])    EKG None  Radiology No results found.  Procedures Procedures (including  critical care time)  Medications Ordered in ED Medications  glucagon (human recombinant) (GLUCAGEN) injection 1 mg (1 mg Intravenous Given 07/25/20 1557)    ED Course  I have reviewed the triage vital signs and the nursing notes.  Pertinent labs & imaging results that were available during my care of the patient were reviewed by me and considered in my medical decision making (see chart for details).    MDM Rules/Calculators/A&P                          Brandon [name] is a 44 year old male with no significant medical history presents the ED after food impaction.  Has not been able to tolerate p.o. for the last 15 hours.  States that he had steak last night.  No history of prior but he states that food does intermittently get stuck at times but self resolves after a minute or 2.  Has tried some carbonated drinks without much relief.  Will get Covid swab and give patient glucagon and see if that will help but will likely need EGD and therefore GI has been consulted and anticipate patient to go for EGD for food impaction removal.  Patient went to endoscopy for scope as Covid was negative.  This chart was dictated using voice recognition software.  Despite best efforts to proofread,  errors can occur which can change the documentation meaning.    Final Clinical Impression(s) / ED Diagnoses Final diagnoses:  Food impaction of esophagus, initial encounter    Rx / DC Orders ED Discharge Orders    None       Virgina Norfolk, DO 07/25/20 1743

## 2020-07-25 NOTE — Anesthesia Postprocedure Evaluation (Signed)
Anesthesia Post Note  Patient: Brandon [name]  Procedure(s) Performed: ESOPHAGOGASTRODUODENOSCOPY (EGD) WITH PROPOFOL (N/A ) FOREIGN BODY REMOVAL BIOPSY     Patient location during evaluation: PACU Anesthesia Type: General Level of consciousness: awake and alert Pain management: pain level controlled Vital Signs Assessment: post-procedure vital signs reviewed and stable Respiratory status: spontaneous breathing, nonlabored ventilation and respiratory function stable Cardiovascular status: blood pressure returned to baseline and stable Postop Assessment: no apparent nausea or vomiting Anesthetic complications: no   No complications documented.  Last Vitals:  Vitals:   07/25/20 1852 07/25/20 1900  BP: (!) 154/97 (!) 148/96  Pulse: 65 66  Resp: 14 18  Temp: 36.8 C   SpO2: 100% 99%    Last Pain:  Vitals:   07/25/20 1900  TempSrc:   PainSc: 0-No pain                 Byrl Latin,W. EDMOND

## 2020-07-25 NOTE — Anesthesia Procedure Notes (Signed)
Procedure Name: Intubation Performed by: Gean Maidens, CRNA Pre-anesthesia Checklist: Patient identified, Suction available, Emergency Drugs available, Patient being monitored and Timeout performed Patient Re-evaluated:Patient Re-evaluated prior to induction Oxygen Delivery Method: Circle system utilized Preoxygenation: Pre-oxygenation with 100% oxygen Induction Type: IV induction and Rapid sequence Laryngoscope Size: Mac and 4 Grade View: Grade I Tube type: Oral Tube size: 7.5 mm Number of attempts: 1 Airway Equipment and Method: Stylet Placement Confirmation: ETT inserted through vocal cords under direct vision,  positive ETCO2 and breath sounds checked- equal and bilateral Secured at: 23 cm Tube secured with: Tape Dental Injury: Teeth and Oropharynx as per pre-operative assessment

## 2020-07-25 NOTE — Transfer of Care (Signed)
Immediate Anesthesia Transfer of Care Note  Patient: Brandon [name]  Procedure(s) Performed: ESOPHAGOGASTRODUODENOSCOPY (EGD) WITH PROPOFOL (N/A ) FOREIGN BODY REMOVAL BIOPSY  Patient Location: PACU  Anesthesia Type:General  Level of Consciousness: awake, alert , oriented and patient cooperative  Airway & Oxygen Therapy: Patient Spontanous Breathing and Patient connected to face mask  Post-op Assessment: Report given to RN and Post -op Vital signs reviewed and stable  Post vital signs: Reviewed and stable  Last Vitals:  Vitals Value Taken Time  BP 154/97 07/25/20 1852  Temp 36.8 C 07/25/20 1852  Pulse 68 07/25/20 1854  Resp 14 07/25/20 1854  SpO2 100 % 07/25/20 1854  Vitals shown include unvalidated device data.  Last Pain:  Vitals:   07/25/20 1758  TempSrc: Oral  PainSc: 6          Complications: No complications documented.

## 2020-07-25 NOTE — ED Triage Notes (Signed)
PT reports c/o food stuck in throat yesterday night. Has vomited multiple times since with no relief.

## 2020-07-25 NOTE — Op Note (Addendum)
[facility] Patient Name: Brandon [name] Procedure Date: 07/25/2020 MRN: [id number] Attending MD: Justice Britain , MD Date of Birth: 10/20/1976 CSN: [id number] Age: 44 Admit Type: Emergency Department Procedure:                Upper GI endoscopy Indications:              Therapeutic procedure, Dysphagia, Foreign body in                            the esophagus, Nausea with vomiting Providers:                Justice Britain, MD, Angus Seller, Laverda Sorenson, Technician, Herbie Drape, CRNA Referring MD:             D[facility] ED Medicines:                General Anesthesia Complications:            No immediate complications. Estimated Blood Loss:     Estimated blood loss was minimal. Procedure:                Pre-Anesthesia Assessment:                           - Prior to the procedure, a History and Physical                            was performed, and patient medications and                            allergies were reviewed. The patient's tolerance of                            previous anesthesia was also reviewed. The risks                            and benefits of the procedure and the sedation                            options and risks were discussed with the patient.                            All questions were answered, and informed consent                            was obtained. Prior Anticoagulants: The patient has                            taken no previous anticoagulant or antiplatelet                            agents except for NSAID medication. ASA Grade  [facility] Patient Name: Brandon [name] Procedure Date: 07/25/2020 MRN: [id number] Attending MD: Justice Britain , MD Date of Birth: 10/20/1976 CSN: [id number] Age: 44 Admit Type: Emergency Department Procedure:                Upper GI endoscopy Indications:              Therapeutic procedure, Dysphagia, Foreign body in                            the esophagus, Nausea with vomiting Providers:                Justice Britain, MD, Angus Seller, Laverda Sorenson, Technician, Herbie Drape, CRNA Referring MD:             Dirk Dress ED Medicines:                General Anesthesia Complications:            No immediate complications. Estimated Blood Loss:     Estimated blood loss was minimal. Procedure:                Pre-Anesthesia Assessment:                           - Prior to the procedure, a History and Physical                            was performed, and patient medications and                            allergies were reviewed. The patient's tolerance of                            previous anesthesia was also reviewed. The risks                            and benefits of the procedure and the sedation                            options and risks were discussed with the patient.                            All questions were answered, and informed consent                            was obtained. Prior Anticoagulants: The patient has                            taken no previous anticoagulant or antiplatelet                            agents except for NSAID medication. ASA Grade  [facility] Patient Name: Brandon [name] Procedure Date: 07/25/2020 MRN: [id number] Attending MD:  Mansouraty , MD Date of Birth: 07/08/1976 CSN: [id number] Age: 44 Admit Type: Emergency Department Procedure:                Upper GI endoscopy Indications:              Therapeutic procedure, Dysphagia, Foreign body in                            the esophagus, Nausea with vomiting Providers:                 Mansouraty, MD, Shanice Khonje, Darlene                            Davis, Technician, Kelley Carver, CRNA Referring MD:             [facility] ED Medicines:                General Anesthesia Complications:            No immediate complications. Estimated Blood Loss:     Estimated blood loss was minimal. Procedure:                Pre-Anesthesia Assessment:                           - Prior to the procedure, a History and Physical                            was performed, and patient medications and                            allergies were reviewed. The patient's tolerance of                            previous anesthesia was also reviewed. The risks                            and benefits of the procedure and the sedation                            options and risks were discussed with the patient.                            All questions were answered, and informed consent                            was obtained. Prior Anticoagulants: The patient has                            taken no previous anticoagulant or antiplatelet                            agents except for NSAID medication. ASA Grade                              [facility] Patient Name: Brandon [name] Procedure Date: 07/25/2020 MRN: [id number] Attending MD:  Mansouraty , MD Date of Birth: 07/08/1976 CSN: [id number] Age: 44 Admit Type: Emergency Department Procedure:                Upper GI endoscopy Indications:              Therapeutic procedure, Dysphagia, Foreign body in                            the esophagus, Nausea with vomiting Providers:                 Mansouraty, MD, Shanice Khonje, Darlene                            Davis, Technician, Kelley Carver, CRNA Referring MD:             [facility] ED Medicines:                General Anesthesia Complications:            No immediate complications. Estimated Blood Loss:     Estimated blood loss was minimal. Procedure:                Pre-Anesthesia Assessment:                           - Prior to the procedure, a History and Physical                            was performed, and patient medications and                            allergies were reviewed. The patient's tolerance of                            previous anesthesia was also reviewed. The risks                            and benefits of the procedure and the sedation                            options and risks were discussed with the patient.                            All questions were answered, and informed consent                            was obtained. Prior Anticoagulants: The patient has                            taken no previous anticoagulant or antiplatelet                            agents except for NSAID medication. ASA Grade                              [facility] Patient Name: Brandon [name] Procedure Date: 07/25/2020 MRN: [id number] Attending MD:  Mansouraty , MD Date of Birth: 07/08/1976 CSN: [id number] Age: 44 Admit Type: Emergency Department Procedure:                Upper GI endoscopy Indications:              Therapeutic procedure, Dysphagia, Foreign body in                            the esophagus, Nausea with vomiting Providers:                 Mansouraty, MD, Shanice Khonje, Darlene                            Davis, Technician, Kelley Carver, CRNA Referring MD:             [facility] ED Medicines:                General Anesthesia Complications:            No immediate complications. Estimated Blood Loss:     Estimated blood loss was minimal. Procedure:                Pre-Anesthesia Assessment:                           - Prior to the procedure, a History and Physical                            was performed, and patient medications and                            allergies were reviewed. The patient's tolerance of                            previous anesthesia was also reviewed. The risks                            and benefits of the procedure and the sedation                            options and risks were discussed with the patient.                            All questions were answered, and informed consent                            was obtained. Prior Anticoagulants: The patient has                            taken no previous anticoagulant or antiplatelet                            agents except for NSAID medication. ASA Grade  

## 2020-07-25 NOTE — Anesthesia Preprocedure Evaluation (Addendum)
Anesthesia Evaluation  Patient identified by MRN, date of birth, ID band Patient awake    Reviewed: Allergy & Precautions, H&P , NPO status , Patient's Chart, lab work & pertinent test results  Airway Mallampati: I  TM Distance: >3 FB Neck ROM: Full    Dental no notable dental hx. (+) Teeth Intact, Dental Advisory Given   Pulmonary neg pulmonary ROS,    Pulmonary exam normal breath sounds clear to auscultation       Cardiovascular hypertension, On Medications  Rhythm:Regular Rate:Normal     Neuro/Psych  Headaches, negative psych ROS   GI/Hepatic negative GI ROS, Neg liver ROS,   Endo/Other  negative endocrine ROS  Renal/GU negative Renal ROS  negative genitourinary   Musculoskeletal   Abdominal   Peds  Hematology negative hematology ROS (+)   Anesthesia Other Findings   Reproductive/Obstetrics negative OB ROS                            Anesthesia Physical Anesthesia Plan  ASA: II and emergent  Anesthesia Plan: General   Post-op Pain Management:    Induction: Intravenous, Rapid sequence and Cricoid pressure planned  PONV Risk Score and Plan: 2 and Treatment may vary due to age or medical condition and Ondansetron  Airway Management Planned: Oral ETT  Additional Equipment:   Intra-op Plan:   Post-operative Plan: Extubation in OR  Informed Consent: I have reviewed the patients History and Physical, chart, labs and discussed the procedure including the risks, benefits and alternatives for the proposed anesthesia with the patient or authorized representative who has indicated his/her understanding and acceptance.     Dental advisory given  Plan Discussed with: CRNA  Anesthesia Plan Comments:         Anesthesia Quick Evaluation

## 2020-07-25 NOTE — Consult Note (Signed)
Gastroenterology Inpatient Consultation   Attending Requesting Consult Virgina Norfolk, [facility]: [facility] Day: 1  Reason for Consult Nausea/vomiting with concern for food bolus impaction   History of Present Illness  Brandon [name] is a 44 y.o. male with a pmh significant for hypertension and migraines.  The GI service is consulted for evaluation and management of nausea with concha mitten vomiting with concern for food bolus impaction.  The patient returned from [facility].  He was at a health care conference.  Patient describes eating [place].  After eating the [place] he describes having a sensation of food getting stuck.  He has had this occur in the past.  He can recall at least 5 or 6 times over the course the last few years.  However, he is always been able to drink water to help the bolus passes or regurgitate it into prior occasions.  He did regurgitate the foodstuffs but continues to have a sensation of impingement.  He continues to have a vomitus of his own secretions.  Because issues persisted came in for further evaluation.  He describes having pyrosis infrequently.  He is taking Tums on only a handful of occasions.  He denies odynophagia.  No significant weight loss unintentionally.  He has never had an upper or lower endoscopy.  He does take NSAIDs but very infrequently.  He has not had any coffee-ground emesis or hematemesis.  The patient denies a history of childhood asthma or eczema or atopic dermatitis.  GI Review of Systems Positive as above Negative for abdominal pain, bloating, change in bowel habits   Review of Systems  General: Denies fevers/chills/weight loss unintentionally HEENT: Denies oral lesions Cardiovascular: Positive for chest soreness after he has regurgitated but otherwise denies any palpitations Pulmonary: Denies shortness of breath/nocturnal cough Gastroenterological: See HPI Genitourinary: Denies darkened urine Hematological:  Denies easy bruising/bleeding Dermatological: Denies jaundice Psychological: Mood is anxious to feel better   Histories  Past Medical History Past Medical History:  Diagnosis Date  . Migraines    Past Surgical History:  Procedure Laterality Date  . WISDOM TOOTH EXTRACTION      Allergies Allergies  Allergen Reactions  . Penicillins     Family History History reviewed. No pertinent family history. The patient's FH is negative for IBD/Liver Disease/GI Malignancies.   Social History Social History   Socioeconomic History  . Marital status: Married    Spouse name: Not on file  . Number of children: Not on file  . Years of education: Not on file  . Highest education level: Not on file  Occupational History  . Not on file  Tobacco Use  . Smoking status: Never Smoker  . Smokeless tobacco: Never Used  Substance and Sexual Activity  . Alcohol use: Yes    Comment: occasional  . Drug use: No  . Sexual activity: Not on file  Other Topics Concern  . Not on file  Social History Narrative  . Not on file   Social Determinants of Health   Financial Resource Strain:   . Difficulty of Paying Living Expenses:   Food Insecurity:   . Worried About Programme researcher, broadcasting/film/video in the Last Year:   . Barista in the Last Year:   Transportation Needs:   . Freight forwarder (Medical):   Marland Kitchen Lack of Transportation (Non-Medical):   Physical Activity:   . Days of Exercise per Week:   . Minutes of Exercise per Session:  Stress:   . Feeling of Stress :   Social Connections:   . Frequency of Communication with Friends and Family:   . Frequency of Social Gatherings with Friends and Family:   . Attends Religious Services:   . Active Member of Clubs or Organizations:   . Attends Banker Meetings:   Marland Kitchen Marital Status:   Intimate Partner Violence:   . Fear of Current or Ex-Partner:   . Emotionally Abused:   Marland Kitchen Physically Abused:   . Sexually Abused:      Medications  Home Medications No current facility-administered medications on file prior to encounter.   Current Outpatient Medications on File Prior to Encounter  Medication Sig Dispense Refill  . ibuprofen (ADVIL,MOTRIN) 200 MG tablet Take 400 mg by mouth every 6 (six) hours as needed for headache or moderate pain.    Marland Kitchen losartan (COZAAR) 25 MG tablet Take 1 tablet (25 mg total) by mouth daily. 90 tablet 3   Scheduled Inpatient Medications  Continuous Inpatient Infusions  PRN Inpatient Medications    Physical Examination  BP (!) 156/102 (BP Location: Left Arm)   Pulse (!) 47   Temp 98.3 F (36.8 C) (Oral)   Resp 18   Ht 5\' 10"  (1.778 m)   Wt 79.4 kg   SpO2 100%   BMI 25.11 kg/m  GEN: NAD, appears stated age, doesn't appear chronically ill, saliva bag next the patient PSYCH: Cooperative, without pressured speech EYE: Conjunctivae pink, sclerae anicteric ENT: MMM, without oral ulcers NECK: Supple CV: Nontachycardic RESP: No audible wheezing GI: NABS, soft, NT/ND, without rebound or guarding MSK/EXT: No lower extremity edema SKIN: No jaundice NEURO:  Alert & Oriented x 3, no focal deficits   Review of Data  I reviewed the following data at the time of this encounter:  Laboratory Studies  No results for input(s): NA, K, CL, CO2, BUN, CREATININE, LABGLOM, GLUCOSE, CALCIUM, MG, PHOS in the last 168 hours. No results for input(s): AST, ALT, GGT, ALKPHOS in the last 168 hours.  Invalid input(s): TBILI, CONJBILI, ALB  No results for input(s): WBC, HGB, HCT, PLT in the last 168 hours. No results for input(s): APTT, INR in the last 168 hours. Computed MELD-Na score unavailable. Necessary lab results were not found in the last year. Computed MELD score unavailable. Necessary lab results were not found in the last year.  Imaging Studies  No relevant studies to review  GI Procedures and Studies  No relevant studies to review   Assessment  Mr. Bartoli is a 44 y.o.  male with a pmh significant for hypertension and migraines.  The GI service is consulted for evaluation and management of nausea with concha mitten vomiting with concern for food bolus impaction.  The patient is hemodynamically stable.  Clinically, he has signs and symptoms suggestive of a persistent food bolus impaction of his recently eaten 54.  Diagnostic endoscopy with potential therapeutic intervention is recommended.  Diagnoses such as silent GERD with peptic stricturing or eosinophilic esophagitis need to be kept in mind.  A hiatal hernia is possible though symptomatically it does not seem clear that he has symptoms that would be suggestive of this normally.  Even if patient passes a food bolus with the recently administered glucagon I would move forward with the patient having an upper endoscopy.  We are awaiting Covid testing at this time.  The risks and benefits of endoscopic evaluation were discussed with the patient; these include but are not limited to the  risk of perforation, infection, bleeding, missed lesions, lack of diagnosis, severe illness requiring hospitalization, as well as anesthesia and sedation related illnesses.  The patient is agreeable to proceed.  We will ask our anesthesia team to see if they have availability to help [place] with monitored anesthesia care versus need for moderate sedation.  All patient questions were answered to the best of my ability, and the patient agrees to the aforementioned plan of action with follow-up as indicated.   Plan/Recommendations  Please maintain NPO Status Await rapid Covid test Plan for tentative EGD later this evening Depending on timing may consider chest x-ray   Thank you for this consult.  We will continue to follow.  Please page/call with questions or concerns.   Corliss Parish, MD [facility] Advanced [facility]oscopy Office # [phone]

## 2020-07-25 NOTE — Discharge Instructions (Signed)
Soft-Food Eating Plan A soft-food eating plan includes foods that are safe and easy to chew and swallow. Your health care provider or dietitian can help you find foods and flavors that fit into this plan. Follow this plan until your health care provider or dietitian says it is safe to start eating other foods and food textures. What are tips for following this plan? General guidelines   Take small bites of food, or cut food into pieces about  inch or smaller. Bite-sized pieces of food are easier to chew and swallow.  Eat moist foods. Avoid overly dry foods.  Avoid foods that: ? Are difficult to swallow, such as dry, chunky, crispy, or sticky foods. ? Are difficult to chew, such as hard, tough, or stringy foods. ? Contain nuts, seeds, or fruits.  Follow instructions from your dietitian about the types of liquids that are safe for you to swallow. You may be allowed to have: ? Thick liquids only. This includes only liquids that are thicker than honey. ? Thin and thick liquids. This includes all beverages and foods that become liquid at room temperature.  To make thick liquids: ? Purchase a commercial liquid thickening powder. These are available at grocery stores and pharmacies. ? Mix the thickener into liquids according to instructions on the label. ? Purchase ready-made thickened liquids. ? Thicken soup by pureeing, straining to remove chunks, and adding flour, potato flakes, or corn starch. ? Add commercial thickener to foods that become liquid at room temperature, such as milk shakes, yogurt, ice cream, gelatin, and sherbet.  Ask your health care provider whether you need to take a fiber supplement. Cooking  Cook meats so they stay tender and moist. Use methods like braising, stewing, or baking in liquid.  Cook vegetables and fruit until they are soft enough to be mashed with a fork.  Peel soft, fresh fruits such as peaches, nectarines, and melons.  When making soup, make sure  chunks of meat and vegetables are smaller than  inch.  Reheat leftover foods slowly so that a tough crust does not form. What foods are allowed? The items listed below may not be a complete list. Talk with your dietitian about what dietary choices are best for you. Grains Breads, muffins, pancakes, or waffles moistened with syrup, jelly, or butter. Dry cereals well-moistened with milk. Moist, cooked cereals. Well-cooked pasta and rice. Vegetables All soft-cooked vegetables. Shredded lettuce. Fruits All canned and cooked fruits. Soft, peeled fresh fruits. Strawberries. Dairy Milk. Cream. Yogurt. Cottage cheese. Soft cheese without the rind. Meats and other protein foods Tender, moist ground meat, poultry, or fish. Meat cooked in gravy or sauces. Eggs. Sweets and desserts Ice cream. Milk shakes. Sherbet. Pudding. Fats and oils Butter. Margarine. Olive, canola, sunflower, and grapeseed oil. Smooth salad dressing. Smooth cream cheese. Mayonnaise. Gravy. What foods are not allowed? The items listed bemay not be a complete list. Talk with your dietitian about what dietary choices are best for you. Grains Coarse or dry cereals, such as bran, granola, and shredded wheat. Tough or chewy crusty breads, such as French bread or baguettes. Breads with nuts, seeds, or fruit. Vegetables All raw vegetables. Cooked corn. Cooked vegetables that are tough or stringy. Tough, crisp, fried potatoes and potato skins. Fruits Fresh fruits with skins or seeds, or both, such as apples, pears, and grapes. Stringy, high-pulp fruits, such as papaya, pineapple, coconut, and mango. Fruit leather and all dried fruit. Dairy Yogurt with nuts or coconut. Meats and other protein foods Hard,   dry sausages. Dry meat, poultry, or fish. Meats with gristle. Fish with bones. Fried meat or fish. Lunch meat and hotdogs. Nuts and seeds. Chunky peanut butter or other nut butters. Sweets and desserts Cakes or cookies that are very  dry or chewy. Desserts with dried fruit, nuts, or coconut. Fried pastries. Very rich pastries. Fats and oils Cream cheese with fruit or nuts. Salad dressings with seeds or chunks. Summary  A soft-food eating plan includes foods that are safe and easy to swallow. Generally, the foods should be soft enough to be mashed with a fork.  Avoid foods that are dry, hard to chew, crunchy, sticky, stringy, or crispy.  Ask your health care provider whether you need to thicken your liquids and if you need to take a fiber supplement. This information is not intended to replace advice given to you by your health care provider. Make sure you discuss any questions you have with your health care provider. Document Revised: 03/23/2019 Document Reviewed: 02/02/2017 Elsevier Patient Education  2020 [employer] YOU HAD AN ENDOSCOPIC PROCEDURE TODAY: Refer to the procedure report and other information in the discharge instructions given to you for any specific questions about what was found during the examination. If this information does not answer your questions, please call [place] office at [phone] to clarify.   YOU SHOULD EXPECT: Some feelings of bloating in the abdomen. Passage of more gas than usual. Walking can help get rid of the air that was put into your GI tract during the procedure and reduce the bloating. If you had a lower endoscopy (such as a colonoscopy or flexible sigmoidoscopy) you may notice spotting of blood in your stool or on the toilet paper. Some abdominal soreness may be present for a day or two, also.  DIET: Your first meal following the procedure should be a light meal and then it is ok to progress to your normal diet. A half-sandwich or bowl of soup is an example of a good first meal. Heavy or fried foods are harder to digest and may make you feel nauseous or bloated. Drink plenty of fluids but you should avoid alcoholic beverages for 24 hours. If you had a esophageal dilation, please  see attached instructions for diet.    ACTIVITY: Your care partner should take you home directly after the procedure. You should plan to take it easy, moving slowly for the rest of the day. You can resume normal activity the day after the procedure however YOU SHOULD NOT DRIVE, use power tools, machinery or perform tasks that involve climbing or major physical exertion for 24 hours (because of the sedation medicines used during the test).   SYMPTOMS TO REPORT IMMEDIATELY: A gastroenterologist can be reached at any hour. Please call [phone]  for any of the following symptoms:  Following lower endoscopy (colonoscopy, flexible sigmoidoscopy) Excessive amounts of blood in the stool  Significant tenderness, worsening of abdominal pains  Swelling of the abdomen that is new, acute  Fever of 100 or higher  Following upper endoscopy (EGD, EUS, ERCP, esophageal dilation) Vomiting of blood or coffee ground material  New, significant abdominal pain  New, significant chest pain or pain under the shoulder blades  Painful or persistently difficult swallowing  New shortness of breath  Black, tarry-looking or red, bloody stools  FOLLOW UP:  If any biopsies were taken you will be contacted by phone or by letter within the next 1-3 weeks. Call [phone]  if you have not heard about the biopsies in  3 weeks.  Please also call with any specific questions about appointments or follow up tests.

## 2020-07-26 ENCOUNTER — Encounter (HOSPITAL_COMMUNITY): Payer: Self-pay | Admitting: Gastroenterology

## 2020-07-29 ENCOUNTER — Other Ambulatory Visit: Payer: Self-pay

## 2020-07-29 LAB — SURGICAL PATHOLOGY

## 2020-08-04 ENCOUNTER — Encounter: Payer: Self-pay | Admitting: Gastroenterology

## 2020-08-07 ENCOUNTER — Telehealth: Payer: Self-pay

## 2020-08-07 NOTE — Telephone Encounter (Signed)
Lm on vm for patient to return call 

## 2020-08-25 ENCOUNTER — Other Ambulatory Visit: Payer: Self-pay

## 2020-08-25 ENCOUNTER — Ambulatory Visit: Payer: BC Managed Care – PPO | Attending: Internal Medicine

## 2020-08-25 DIAGNOSIS — Z23 Encounter for immunization: Secondary | ICD-10-CM

## 2020-08-25 NOTE — Progress Notes (Signed)
   Covid-19 Vaccination Clinic  Name:  [name]    MRN: [id number] DOB: 03-Dec-1976  08/25/2020  [name] was observed post Covid-19 immunization for 15 minutes without incident. He was provided with Vaccine Information Sheet and instruction to access the V-Safe system.   [name] was instructed to call 911 with any severe reactions post vaccine: Marland Kitchen Difficulty breathing  . Swelling of face and throat  . A fast heartbeat  . A bad rash all over body  . Dizziness and weakness

## 2020-09-25 ENCOUNTER — Telehealth: Payer: Self-pay | Admitting: Gastroenterology

## 2020-09-25 NOTE — Telephone Encounter (Signed)
Spoke with patient to schedule EGD with dilation. Pt has a pre-visit on 09/25/20 at 2:30 pm. EGD with dilation is 10/15/20 at 4 pm with a 3 pm arrival time. Spoke with Bri, previsit RN to make her aware that patient was added.

## 2020-09-25 NOTE — Telephone Encounter (Signed)
Patient called states he saw  Dr. Meridee Score at the hospital and he needs an EGD with balloon scheduled please advise

## 2020-09-26 ENCOUNTER — Other Ambulatory Visit: Payer: Self-pay

## 2020-09-26 ENCOUNTER — Ambulatory Visit (AMBULATORY_SURGERY_CENTER): Payer: Self-pay

## 2020-09-26 VITALS — Ht 70.0 in | Wt 179.8 lb

## 2020-09-26 DIAGNOSIS — R1310 Dysphagia, unspecified: Secondary | ICD-10-CM

## 2020-09-26 NOTE — Progress Notes (Signed)

## 2020-09-30 ENCOUNTER — Encounter: Payer: Self-pay | Admitting: Gastroenterology

## 2020-10-15 ENCOUNTER — Ambulatory Visit (AMBULATORY_SURGERY_CENTER): Payer: BC Managed Care – PPO | Admitting: Gastroenterology

## 2020-10-15 ENCOUNTER — Other Ambulatory Visit: Payer: BC Managed Care – PPO

## 2020-10-15 ENCOUNTER — Other Ambulatory Visit: Payer: Self-pay

## 2020-10-15 ENCOUNTER — Encounter: Payer: Self-pay | Admitting: Gastroenterology

## 2020-10-15 VITALS — BP 114/64 | HR 51 | Temp 97.7°F | Resp 20 | Ht 70.0 in | Wt 179.0 lb

## 2020-10-15 DIAGNOSIS — K319 Disease of stomach and duodenum, unspecified: Secondary | ICD-10-CM

## 2020-10-15 DIAGNOSIS — K269 Duodenal ulcer, unspecified as acute or chronic, without hemorrhage or perforation: Secondary | ICD-10-CM | POA: Diagnosis not present

## 2020-10-15 DIAGNOSIS — K259 Gastric ulcer, unspecified as acute or chronic, without hemorrhage or perforation: Secondary | ICD-10-CM | POA: Diagnosis not present

## 2020-10-15 DIAGNOSIS — K2980 Duodenitis without bleeding: Secondary | ICD-10-CM

## 2020-10-15 DIAGNOSIS — K200 Eosinophilic esophagitis: Secondary | ICD-10-CM | POA: Diagnosis not present

## 2020-10-15 DIAGNOSIS — K3189 Other diseases of stomach and duodenum: Secondary | ICD-10-CM

## 2020-10-15 DIAGNOSIS — K2970 Gastritis, unspecified, without bleeding: Secondary | ICD-10-CM

## 2020-10-15 DIAGNOSIS — K2289 Other specified disease of esophagus: Secondary | ICD-10-CM | POA: Diagnosis not present

## 2020-10-15 DIAGNOSIS — R1310 Dysphagia, unspecified: Secondary | ICD-10-CM

## 2020-10-15 MED ORDER — SODIUM CHLORIDE 0.9 % IV SOLN
500.0000 mL | INTRAVENOUS | Status: DC
Start: 2020-10-15 — End: 2020-10-15

## 2020-10-15 NOTE — Progress Notes (Signed)
pt tolerated well. VSS. awake and to recovery. Report given to RN. Bite block inserted and removed without trauma. 

## 2020-10-15 NOTE — Progress Notes (Signed)
Called to room to assist during endoscopic procedure.  Patient ID and intended procedure confirmed with present staff. Received instructions for my participation in the procedure from the performing physician.  

## 2020-10-15 NOTE — Progress Notes (Signed)
Pt's states no medical or surgical changes since previsit or office visit.  [facility] - vitals 

## 2020-10-15 NOTE — Patient Instructions (Addendum)
Please, read all of the handouts given to you by your recovery room nurse. Use Cepacol lozenges and/or Chloraseptic spray for throat pain. Call us if the pain is bad.  Limit your ibuprofen and aspirin intake.  YOU HAD AN ENDOSCOPIC PROCEDURE TODAY AT THE  ENDOSCOPY CENTER:   Refer to the procedure report that was given to you for any specific questions about what was found during the examination.  If the procedure report does not answer your questions, please call your gastroenterologist to clarify.  If you requested that your care partner not be given the details of your procedure findings, then the procedure report has been included in a sealed envelope for you to review at your convenience later.  YOU SHOULD EXPECT: Some feelings of bloating in the abdomen. Passage of more gas than usual.  Walking can help get rid of the air that was put into your GI tract during the procedure and reduce the bloating.   Please Note:  You might notice some irritation and congestion in your nose or some drainage.  This is from the oxygen used during your procedure.  There is no need for concern and it should clear up in a day or so.  SYMPTOMS TO REPORT IMMEDIATELY:   Following upper endoscopy (EGD)  Vomiting of blood or coffee ground material  New chest pain or pain under the shoulder blades  Painful or persistently difficult swallowing  New shortness of breath  Fever of 100F or higher  Black, tarry-looking stools  For urgent or emergent issues, a gastroenterologist can be reached at any hour by calling [phone]. Do not use MyChart messaging for urgent concerns.    DIET:  We do recommend nothing by mouth until 5 p m.  From 5 p m to 6 pm you may have clear liquids.  If you tolerate clear liquids, you may stay on a soft diet for the rest of the evening.  You may resume a regular diet tomorrow.  ACTIVITY:  You should plan to take it easy for the rest of today and you should NOT DRIVE or use heavy  machinery until tomorrow (because of the sedation medicines used during the test).    FOLLOW UP: Our staff will call the number listed on your records 48-72 hours following your procedure to check on you and address any questions or concerns that you may have regarding the information given to you following your procedure. If we do not reach you, we will leave a message.  We will attempt to reach you two times.  During this call, we will ask if you have developed any symptoms of COVID 19. If you develop any symptoms (ie: fever, flu-like symptoms, shortness of breath, cough etc.) before then, please call [phone].  If you test positive for Covid 19 in the 2 weeks post procedure, please call and report this information to [place].    If any biopsies were taken you will be contacted by phone or by letter within the next 1-3 weeks.  Please call us at [phone] if you have not heard about the biopsies in 3 weeks.    SIGNATURES/CONFIDENTIALITY: You and/or your care partner have signed paperwork which will be entered into your electronic medical record.  These signatures attest to the fact that that the information above on your After Visit Summary has been reviewed and is understood.  Full responsibility of the confidentiality of this discharge information lies with you and/or your care-partner.

## 2020-10-15 NOTE — Op Note (Addendum)
[facility] Patient Name: Brandon [name] Procedure Date: 10/15/2020 3:11 PM MRN: [id number] Endoscopist: Justice Britain , MD Age: 44 Referring MD:  Date of Birth: Jul 22, 1976 Gender: Male Account #: [id number] Procedure:                Upper GI endoscopy Indications:              Dysphagia, Exclusion of eosinophilic esophagitis Medicines:                Monitored Anesthesia Care Procedure:                Pre-Anesthesia Assessment:                           - Prior to the procedure, a History and Physical                            was performed, and patient medications and                            allergies were reviewed. The patient's tolerance of                            previous anesthesia was also reviewed. The risks                            and benefits of the procedure and the sedation                            options and risks were discussed with the patient.                            All questions were answered, and informed consent                            was obtained. Prior Anticoagulants: The patient has                            taken no previous anticoagulant or antiplatelet                            agents except for NSAID medication. ASA Grade                            Assessment: II - A patient with mild systemic                            disease. After reviewing the risks and benefits,                            the patient was deemed in satisfactory condition to                            undergo the procedure.  After obtaining informed consent, the endoscope was                            passed under direct vision. Throughout the                            procedure, the patient's blood pressure, pulse, and                            oxygen saturations were monitored continuously. The                            Endoscope was introduced through the mouth, and                            advanced to the second part  of duodenum. The upper                            GI endoscopy was accomplished without difficulty.                            The patient tolerated the procedure. Scope In: Scope Out: Findings:                 Nodule/polypoid lesion on the left epliglottic                            cartilage noted.                           Mucosal changes including feline appearance,                            longitudinal furrows and crepe paper esophagus were                            found in the entire esophagus. Esophageal findings                            were graded using the Eosinophilic Esophagitis                            Endoscopic Reference Score (EoE-EREFS) as: Edema                            Grade 0 Normal (distinct vascular markings), Rings                            Grade 1 Mild (subtle circumferential ridges seen on                            esophageal distension), Exudates Grade 0 None (no                            white lesions seen), Furrows Grade 1  Present                            (vertical lines with or without visible depth) and                            Stricture present. Biopsies were obtained from the                            proximal and middle esophagus with cold forceps for                            histology of suspected eosinophilic/lymphocytic                            esophagitis. Biopsies were obtained from the distal                            esophagus with cold forceps for histology of                            suspected eosinophilic/lymphocytic esophagitis.                            After the rest of the EGD was complete, a guidewire                            was placed and the scope was withdrawn. Dilation                            was performed with a Savary dilator with no                            resistance at 12 mm and 13 mm, mild resistance at                            14 mm and moderate resistance at 16 mm. The                             dilation site was examined following endoscope                            reinsertion and showed mild mucosal disruption.                           Localized moderate inflammation characterized by                            congestion (edema), erosions, erythema and shallow                            ulcerations was found in the gastric body, in the  gastric antrum and in the prepyloric region of the                            stomach. Biopsies were taken with a cold forceps                            for histology and Helicobacter pylori testing.                           Localized moderate inflammation characterized by                            erythema, friability and shallow ulcerations was                            found in the duodenal bulb and D1/D2 angle.                            Biopsies were taken with a cold forceps for                            histology.                           No other gross lesions were noted in the second                            portion of the duodenum. Complications:            No immediate complications. Estimated Blood Loss:     Estimated blood loss was minimal. Impression:               - Epliglotic polypoid lesion noted.                           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 Biopsied.                            Dilated with some evidence of mucosal disruption.                           - Gastritis. Biopsied.                           - Duodenitis in bulb. Biopsied. No gross lesions in                            the second portion of the duodenum. Recommendation:           - The patient will be observed post-procedure,                            until all discharge criteria are met.                           -  Discharge patient to home.                           - Patient has a contact number available for                             emergencies. The signs and symptoms of potential                            delayed complications were discussed with the                            patient. Return to normal activities tomorrow.                            Written discharge instructions were provided to the                            patient.                           - Dilation diet as per protocol.                           - Please use Cepacol or Halls Lozenges +/-                            Chloraseptic spray for next 72-96 hours to aid in                            sore thoat should you experience this.                           - Continue present medications.                           - Await pathology results.                           - Repeat upper endoscopy in 2 months for                            retreatment.                           - Patient needs colonoscopy for screening and we                            can proceed with scheduling this together at time                            of next procedure.                           - Minimize NSAID use moving forward as able.                           -  Obtain a fasting Gastrin level (may be elevated                            due to current PPI use but if very elevated may                            have some clinical significance for ZE). Suspect                            however, issues more a result of NSAID use and                            potentially EoE.                           - ENT referral can be placed to further evaluate                            the Epiglottic polypoid lesion/nodule.                           - The findings and recommendations were discussed                            with the patient.                           - The findings and recommendations were discussed                            with the patient's family. Justice Britain, MD 10/15/2020 4:10:04 PM

## 2020-10-16 ENCOUNTER — Other Ambulatory Visit: Payer: Self-pay

## 2020-10-16 DIAGNOSIS — J387 Other diseases of larynx: Secondary | ICD-10-CM

## 2020-10-17 ENCOUNTER — Telehealth: Payer: Self-pay | Admitting: *Deleted

## 2020-10-17 NOTE — Telephone Encounter (Signed)
1. Have you developed a fever since your procedure? No   2.   Have you had an respiratory symptoms (SOB or cough) since your procedure? no  3.   Have you tested positive for COVID 19 since your procedure no  4.   Have you had any family members/close contacts diagnosed with the COVID 19 since your procedure?  no   If yes to any of these questions please route to Laverna Peace, RN and Karlton Lemon, RN   Follow up Call-  Call back number 10/15/2020  Post procedure Call Back phone  # [phone]  Permission to leave phone message Yes  Some recent data might be hidden     Patient questions:  Do you have a fever, pain , or abdominal swelling? No. Pain Score  0 *  Have you tolerated food without any problems? Yes.    Have you been able to return to your normal activities? Yes.    Do you have any questions about your discharge instructions: Diet   No. Medications  No. Follow up visit  No.  Do you have questions or concerns about your Care? No.  Actions: * If pain score is 4 or above: No action needed, pain <4.

## 2020-10-18 LAB — GASTRIN: Gastrin: 184 pg/mL — ABNORMAL HIGH (ref <=100)

## 2020-10-28 ENCOUNTER — Encounter: Payer: Self-pay | Admitting: Gastroenterology

## 2020-12-26 DIAGNOSIS — H01001 Unspecified blepharitis right upper eyelid: Secondary | ICD-10-CM | POA: Diagnosis not present

## 2020-12-26 DIAGNOSIS — H01004 Unspecified blepharitis left upper eyelid: Secondary | ICD-10-CM | POA: Diagnosis not present

## 2021-03-02 DIAGNOSIS — J029 Acute pharyngitis, unspecified: Secondary | ICD-10-CM | POA: Diagnosis not present

## 2021-04-30 ENCOUNTER — Other Ambulatory Visit (HOSPITAL_COMMUNITY): Payer: Self-pay | Admitting: Internal Medicine

## 2021-06-04 ENCOUNTER — Encounter (HOSPITAL_COMMUNITY): Payer: Self-pay | Admitting: *Deleted

## 2021-06-04 ENCOUNTER — Other Ambulatory Visit (HOSPITAL_COMMUNITY): Payer: Self-pay | Admitting: Internal Medicine

## 2021-06-11 ENCOUNTER — Other Ambulatory Visit: Payer: Self-pay | Admitting: Gastroenterology

## 2021-07-22 ENCOUNTER — Other Ambulatory Visit (HOSPITAL_COMMUNITY): Payer: Self-pay | Admitting: Internal Medicine

## 2021-07-24 ENCOUNTER — Telehealth (HOSPITAL_COMMUNITY): Payer: Self-pay | Admitting: Internal Medicine

## 2021-07-27 ENCOUNTER — Encounter: Payer: Self-pay | Admitting: Gastroenterology

## 2021-08-26 ENCOUNTER — Encounter: Payer: Self-pay | Admitting: Gastroenterology

## 2021-09-04 ENCOUNTER — Ambulatory Visit (AMBULATORY_SURGERY_CENTER): Payer: BC Managed Care – PPO

## 2021-09-04 VITALS — Ht 70.0 in | Wt 180.0 lb

## 2021-09-04 DIAGNOSIS — J387 Other diseases of larynx: Secondary | ICD-10-CM

## 2021-09-04 DIAGNOSIS — R1310 Dysphagia, unspecified: Secondary | ICD-10-CM

## 2021-09-04 DIAGNOSIS — Z1211 Encounter for screening for malignant neoplasm of colon: Secondary | ICD-10-CM

## 2021-09-04 NOTE — Progress Notes (Signed)
No egg or soy allergy known to patient  No issues known to pt with past sedation with any surgeries or procedures Patient denies ever being told they had issues or difficulty with intubation  No FH of Malignant Hyperthermia Pt is not on diet pills Pt is not on  home 02  Pt is not on blood thinners  Pt denies issues with constipation  No A fib or A flutter  EMMI video to pt or via MyChart  COVID 19 guidelines implemented in [place] today with Pt and RN   Pt is fully vaccinated  for Covid   Miralax prep  Virtual previsit  Due to the COVID-19 pandemic we are asking patients to follow certain guidelines.  Pt aware of COVID protocols and [facility] guidelines

## 2021-09-08 ENCOUNTER — Ambulatory Visit: Payer: Self-pay

## 2021-09-08 ENCOUNTER — Ambulatory Visit (INDEPENDENT_AMBULATORY_CARE_PROVIDER_SITE_OTHER): Payer: BC Managed Care – PPO | Admitting: Orthopedic Surgery

## 2021-09-08 ENCOUNTER — Other Ambulatory Visit: Payer: Self-pay

## 2021-09-08 DIAGNOSIS — G8929 Other chronic pain: Secondary | ICD-10-CM

## 2021-09-08 DIAGNOSIS — M25511 Pain in right shoulder: Secondary | ICD-10-CM | POA: Diagnosis not present

## 2021-09-15 ENCOUNTER — Encounter: Payer: Self-pay | Admitting: Gastroenterology

## 2021-09-22 ENCOUNTER — Telehealth: Payer: BC Managed Care – PPO | Admitting: Family Medicine

## 2021-09-22 DIAGNOSIS — Z7184 Encounter for health counseling related to travel: Secondary | ICD-10-CM

## 2021-09-22 NOTE — Progress Notes (Signed)
Brandon [name] needs travel clinic

## 2021-09-23 ENCOUNTER — Ambulatory Visit (AMBULATORY_SURGERY_CENTER): Payer: BC Managed Care – PPO | Admitting: Gastroenterology

## 2021-09-23 ENCOUNTER — Encounter: Payer: Self-pay | Admitting: Gastroenterology

## 2021-09-23 ENCOUNTER — Other Ambulatory Visit: Payer: Self-pay

## 2021-09-23 VITALS — BP 118/66 | HR 49 | Temp 97.7°F | Resp 21 | Ht 70.0 in | Wt 180.0 lb

## 2021-09-23 DIAGNOSIS — Z1211 Encounter for screening for malignant neoplasm of colon: Secondary | ICD-10-CM | POA: Diagnosis not present

## 2021-09-23 DIAGNOSIS — R1310 Dysphagia, unspecified: Secondary | ICD-10-CM | POA: Diagnosis not present

## 2021-09-23 DIAGNOSIS — K449 Diaphragmatic hernia without obstruction or gangrene: Secondary | ICD-10-CM

## 2021-09-23 DIAGNOSIS — K2289 Other specified disease of esophagus: Secondary | ICD-10-CM | POA: Diagnosis not present

## 2021-09-23 DIAGNOSIS — K219 Gastro-esophageal reflux disease without esophagitis: Secondary | ICD-10-CM | POA: Diagnosis not present

## 2021-09-23 DIAGNOSIS — J387 Other diseases of larynx: Secondary | ICD-10-CM

## 2021-09-23 DIAGNOSIS — K2901 Acute gastritis with bleeding: Secondary | ICD-10-CM

## 2021-09-23 MED ORDER — SODIUM CHLORIDE 0.9 % IV SOLN: 500.0000 mL | Freq: Once | INTRAVENOUS | Status: DC

## 2021-09-23 NOTE — Op Note (Signed)
[place] Patient Name: Brandon [name] Procedure Date: 09/23/2021 2:15 PM MRN: [id number] Endoscopist: Justice Britain , MD Age: 45 Referring MD:  Date of Birth: 08-25-1976 Gender: Male Account #: [id number] Procedure:                Colonoscopy Indications:              Screening for colorectal malignant neoplasm, This                            is the patient's first colonoscopy Medicines:                Monitored Anesthesia Care Procedure:                Pre-Anesthesia Assessment:                           - Prior to the procedure, a History and Physical                            was performed, and patient medications and                            allergies were reviewed. The patient's tolerance of                            previous anesthesia was also reviewed. The risks                            and benefits of the procedure and the sedation                            options and risks were discussed with the patient.                            All questions were answered, and informed consent                            was obtained. Prior Anticoagulants: The patient has                            taken no previous anticoagulant or antiplatelet                            agents except for NSAID medication. ASA Grade                            Assessment: II - A patient with mild systemic                            disease. After reviewing the risks and benefits,                            the patient was deemed in satisfactory condition to  undergo the procedure.                           After obtaining informed consent, the colonoscope                            was passed under direct vision. Throughout the                            procedure, the patient's blood pressure, pulse, and                            oxygen saturations were monitored continuously. The                            Olympus CF-HQ190L ([id number]) Colonoscope was                             introduced through the anus and advanced to the 5                            cm into the ileum. The colonoscopy was performed                            without difficulty. The patient tolerated the                            procedure. The quality of the bowel preparation was                            good. The terminal ileum, ileocecal valve,                            appendiceal orifice, and rectum were photographed. Scope In: 2:44:20 PM Scope Out: 2:57:41 PM Scope Withdrawal Time: 0 hours 9 minutes 47 seconds  Total Procedure Duration: 0 hours 13 minutes 21 seconds  Findings:                 The digital rectal exam findings include                            hemorrhoids. Pertinent negatives include no                            palpable rectal lesions.                           The terminal ileum and ileocecal valve appeared                            normal.                           Normal mucosa was found in the entire colon.  Non-bleeding non-thrombosed internal hemorrhoids                            were found during retroflexion, during perianal                            exam and during digital exam. The hemorrhoids were                            Grade II (internal hemorrhoids that prolapse but                            reduce spontaneously). Complications:            No immediate complications. Estimated Blood Loss:     Estimated blood loss: none. Impression:               - Hemorrhoids found on digital rectal exam.                           - The examined portion of the ileum was normal.                           - Normal mucosa in the entire examined colon.                           - Non-bleeding non-thrombosed internal hemorrhoids. Recommendation:           - The patient will be observed post-procedure,                            until all discharge criteria are met.                           - Discharge patient to  home.                           - Patient has a contact number available for                            emergencies. The signs and symptoms of potential                            delayed complications were discussed with the                            patient. Return to normal activities tomorrow.                            Written discharge instructions were provided to the                            patient.                           - High fiber diet.                           -  Use FiberCon 1-2 tablets PO daily.                           - Continue present medications.                           - Repeat colonoscopy in 10 years for screening                            purposes.                           - The findings and recommendations were discussed                            with the patient.                           - The findings and recommendations were discussed                            with the patient's family. Justice Britain, MD 09/23/2021 3:08:47 PM

## 2021-09-23 NOTE — Op Note (Signed)
[facility] Patient Name: Brandon [name] Procedure Date: 09/23/2021 2:15 PM MRN: [id number] Endoscopist: Corliss Parish , MD Age: 45 Referring MD:  Date of Birth: 27-Dec-1975 Gender: Male Account #: [id number] Procedure:                Upper GI endoscopy Indications:              Dysphagia, Eosinophilic esophagitis, Follow-up of                            eosinophilic esophagitis Medicines:                Monitored Anesthesia Care Procedure:                Pre-Anesthesia Assessment:                           - Prior to the procedure, a History and Physical                            was performed, and patient medications and                            allergies were reviewed. The patient's tolerance of                            previous anesthesia was also reviewed. The risks                            and benefits of the procedure and the sedation                            options and risks were discussed with the patient.                            All questions were answered, and informed consent                            was obtained. Prior Anticoagulants: The patient has                            taken no previous anticoagulant or antiplatelet                            agents except for NSAID medication. ASA Grade                            Assessment: II - A patient with mild systemic                            disease. After reviewing the risks and benefits,                            the patient was deemed in satisfactory condition to  undergo the procedure.                           After obtaining informed consent, the endoscope was                            passed under direct vision. Throughout the                            procedure, the patient's blood pressure, pulse, and                            oxygen saturations were monitored continuously. The                            GIF [id number] was introduced through the                             mouth, and advanced to the second part of duodenum.                            The upper GI endoscopy was accomplished without                            difficulty. The patient tolerated the procedure. Scope In: Scope Out: Findings:                 Mucosal changes were found in the entire esophagus.                            Esophageal findings were graded using the                            Eosinophilic Esophagitis Endoscopic Reference Score                            (EoE-EREFS) as: Edema Grade 0 Normal (distinct                            vascular markings), Rings Grade 1 Mild (subtle                            circumferential ridges seen on esophageal                            distension), Exudates Grade 0 None (no white                            lesions seen), Furrows Grade 1 Present (vertical                            lines with or without visible depth) and Stricture                            none (no  stricture found). Biopsies were obtained                            from the proximal and middle esophagus with cold                            forceps for histology. Biopsies were obtained from                            the distal esophagus with cold forceps for                            histology. After the rest of the EGD was complete,                            a guidewire was placed and the scope was withdrawn.                            Dilation was performed with a Savary dilator with                            no resistance at 16 mm and mild resistance at 18                            mm. The dilation site was examined following                            endoscope reinsertion and showed no perforation.                           The Z-line was irregular and was found 41 cm from                            the incisors.                           A 1 cm hiatal hernia was present.                           Patchy mildly erythematous mucosa without  bleeding                            was found in the entire examined stomach -                            previously biopsied.                           No gross lesions were noted in the duodenal bulb,                            in the first portion of the duodenum and in the  second portion of the duodenum. Complications:            No immediate complications. Estimated Blood Loss:     Estimated blood loss was minimal. Impression:               - Esophageal mucosal changes consistent with known                            eosinophilic esophagitis. Biopsied. Dilated.                           - Z-line irregular, 41 cm from the incisors.                           - 1 cm hiatal hernia.                           - Erythematous mucosa in the stomach - previously                            biopsied and negative for HP.                           - No gross lesions in the duodenal bulb, in the                            first portion of the duodenum and in the second                            portion of the duodenum. Recommendation:           - Proceed to scheduled colonoscopy.                           - Observe patient's clinical course.                           - Await pathology results.                           - May decrease PPI to once daily, as long as                            symptoms remain controlled.                           - Will see how pathology looks in regards to overt                            eosinophil count to see if patient is truly a                            PPI-REoE vs PPI-nREoE.                           - The findings and recommendations were discussed  with the patient.                           - The findings and recommendations were discussed                            with the patient's family. Corliss Parish, MD 09/23/2021 3:06:16 PM

## 2021-09-23 NOTE — Progress Notes (Signed)
Called to room to assist during endoscopic procedure.  Patient ID and intended procedure confirmed with present staff. Received instructions for my participation in the procedure from the performing physician.  

## 2021-09-23 NOTE — Progress Notes (Signed)
GASTROENTEROLOGY PROCEDURE H&P NOTE   Primary Care Physician: Pcp, No  HPI: Brandon [name] is a 45 y.o. male who presents for EGD/Colonoscopy for follow up of EoE, Dysphagia, and Colon Cancer Screening.  Past Medical History:  Diagnosis Date   Allergy    mold   Dysphasia 07/2020   GERD (gastroesophageal reflux disease)    Hypertension    Migraines    Schatzki's ring 07/2020   Past Surgical History:  Procedure Laterality Date   BIOPSY  07/25/2020   Procedure: BIOPSY;  Surgeon: Lemar Lofty., MD;  Location: [facility];  Service: Gastroenterology;;   ESOPHAGOGASTRODUODENOSCOPY (EGD) WITH PROPOFOL N/A 07/25/2020   Procedure: ESOPHAGOGASTRODUODENOSCOPY (EGD) WITH PROPOFOL;  Surgeon: Lemar Lofty., MD;  Location: [facility];  Service: Gastroenterology;  Laterality: N/A;   FOREIGN BODY REMOVAL  07/25/2020   Procedure: FOREIGN BODY REMOVAL;  Surgeon: Lemar Lofty., MD;  Location: [facility];  Service: Gastroenterology;;   UPPER GASTROINTESTINAL ENDOSCOPY     WISDOM TOOTH EXTRACTION     Current Outpatient Medications  Medication Sig Dispense Refill   ibuprofen (ADVIL,MOTRIN) 200 MG tablet Take 400 mg by mouth every 6 (six) hours as needed for headache or moderate pain.     losartan (COZAAR) 25 MG tablet Take 1 tablet (25 mg total) by mouth daily. Need appointment for further refills 30 tablet 2   omeprazole (PRILOSEC) 40 MG capsule TAKE 1 CAPSULE BY MOUTH 2 TIMES DAILY BEFORE A MEAL 60 capsule 5   No current facility-administered medications for this visit.    Current Outpatient Medications:    ibuprofen (ADVIL,MOTRIN) 200 MG tablet, Take 400 mg by mouth every 6 (six) hours as needed for headache or moderate pain., Disp: , Rfl:    losartan (COZAAR) 25 MG tablet, Take 1 tablet (25 mg total) by mouth daily. Need appointment for further refills, Disp: 30 tablet, Rfl: 2   omeprazole (PRILOSEC) 40 MG capsule, TAKE 1 CAPSULE BY MOUTH 2 TIMES DAILY  BEFORE A MEAL, Disp: 60 capsule, Rfl: 5 Allergies  Allergen Reactions   Penicillins    Family History  Problem Relation Age of Onset   Colon cancer Neg Hx    Colon polyps Neg Hx    Esophageal cancer Neg Hx    Rectal cancer Neg Hx    Stomach cancer Neg Hx    Social History   Socioeconomic History   Marital status: Married    Spouse name: Not on file   Number of children: Not on file   Years of education: Not on file   Highest education level: Not on file  Occupational History   Not on file  Tobacco Use   Smoking status: Never   Smokeless tobacco: Never  Vaping Use   Vaping Use: Never used  Substance and Sexual Activity   Alcohol use: Yes    Alcohol/week: 1.0 standard drink    Types: 1 Standard drinks or equivalent per week   Drug use: Never   Sexual activity: Yes  Other Topics Concern   Not on file  Social History Narrative   Not on file   Social Determinants of Health   Financial Resource Strain: Not on file  Food Insecurity: Not on file  Transportation Needs: Not on file  Physical Activity: Not on file  Stress: Not on file  Social Connections: Not on file  Intimate Partner Violence: Not on file    Physical Exam: There were no vitals filed for this visit. There is no height or  weight on file to calculate BMI. GEN: NAD EYE: Sclerae anicteric ENT: MMM CV: Non-tachycardic GI: Soft, NT/ND NEURO:  Alert & Oriented x 3  Lab Results: No results for input(s): WBC, HGB, HCT, PLT in the last 72 hours. BMET No results for input(s): NA, K, CL, CO2, GLUCOSE, BUN, CREATININE, CALCIUM in the last 72 hours. LFT No results for input(s): PROT, ALBUMIN, AST, ALT, ALKPHOS, BILITOT, BILIDIR, IBILI in the last 72 hours. PT/INR No results for input(s): LABPROT, INR in the last 72 hours.   Impression / Plan: This is a Brandon [name] who presents for EGD/Colonoscopy for follow up of EoE, Dysphagia, and Colon Cancer Screening.  The risks and benefits of endoscopic  evaluation/treatment were discussed with the patient and/or family; these include but are not limited to the risk of perforation, infection, bleeding, missed lesions, lack of diagnosis, severe illness requiring hospitalization, as well as anesthesia and sedation related illnesses.  The patient's history has been reviewed, patient examined, no change in status, and deemed stable for procedure.  The patient and/or family is agreeable to proceed.    Corliss Parish, MD [facility] Advanced Endoscopy Office # [phone]

## 2021-09-23 NOTE — Progress Notes (Signed)
Pt's states no medical or surgical changes since previsit or office visit. VS assessed by C.W 

## 2021-09-23 NOTE — Progress Notes (Signed)
Report to PACU, RN, vss, BBS= Clear.  

## 2021-09-23 NOTE — Patient Instructions (Signed)
Handouts on gastritis, Hiatal hernia, hemorrhoids, and post dilation diet given. May decrease your PPI to once daily, as long symptoms remain controlled. Await pathology results. Post dilation diet.  High fiber diet. Use FiberCon 1-2 tablets daily.  Repeat colonoscopy in 10 years for screening purposes.   YOU HAD AN ENDOSCOPIC PROCEDURE TODAY AT THE [facility]:   Refer to the procedure report that was given to you for any specific questions about what was found during the examination.  If the procedure report does not answer your questions, please call your gastroenterologist to clarify.  If you requested that your care partner not be given the details of your procedure findings, then the procedure report has been included in a sealed envelope for you to review at your convenience later.  YOU SHOULD EXPECT: Some feelings of bloating in the abdomen. Passage of more gas than usual.  Walking can help get rid of the air that was put into your GI tract during the procedure and reduce the bloating. If you had a lower endoscopy (such as a colonoscopy or flexible sigmoidoscopy) you may notice spotting of blood in your stool or on the toilet paper. If you underwent a bowel prep for your procedure, you may not have a normal bowel movement for a few days.  Please Note:  You might notice some irritation and congestion in your nose or some drainage.  This is from the oxygen used during your procedure.  There is no need for concern and it should clear up in a day or so.  SYMPTOMS TO REPORT IMMEDIATELY:  Following lower endoscopy (colonoscopy or flexible sigmoidoscopy):  Excessive amounts of blood in the stool  Significant tenderness or worsening of abdominal pains  Swelling of the abdomen that is new, acute  Fever of 100F or higher  Following upper endoscopy (EGD)  Vomiting of blood or coffee ground material  New chest pain or pain under the shoulder blades  Painful or persistently  difficult swallowing  New shortness of breath  Fever of 100F or higher  Black, tarry-looking stools  For urgent or emergent issues, a gastroenterologist can be reached at any hour by calling [phone]. Do not use MyChart messaging for urgent concerns.    DIET:  We do recommend a small meal at first, but then you may proceed to your regular diet.  Drink plenty of fluids but you should avoid alcoholic beverages for 24 hours.  ACTIVITY:  You should plan to take it easy for the rest of today and you should NOT DRIVE or use heavy machinery until tomorrow (because of the sedation medicines used during the test).    FOLLOW UP: Our staff will call the number listed on your records 48-72 hours following your procedure to check on you and address any questions or concerns that you may have regarding the information given to you following your procedure. If we do not reach you, we will leave a message.  We will attempt to reach you two times.  During this call, we will ask if you have developed any symptoms of COVID 19. If you develop any symptoms (ie: fever, flu-like symptoms, shortness of breath, cough etc.) before then, please call [phone].  If you test positive for Covid 19 in the 2 weeks post procedure, please call and report this information to [place].    If any biopsies were taken you will be contacted by phone or by letter within the next 1-3 weeks.  Please call us at [phone] if you have not heard about the biopsies in 3 weeks.    SIGNATURES/CONFIDENTIALITY: You and/or your care partner have signed paperwork which will be entered into your electronic medical record.  These signatures attest to the fact that that the information above on your After Visit Summary has been reviewed and is understood.  Full responsibility of the confidentiality of this discharge information lies with you and/or your care-partner.

## 2021-09-25 ENCOUNTER — Telehealth: Payer: Self-pay | Admitting: *Deleted

## 2021-09-25 NOTE — Telephone Encounter (Signed)
First follow up call attempt.  LVM. 

## 2021-09-25 NOTE — Telephone Encounter (Signed)
  Follow up Call-  Call back number 09/23/2021 10/15/2020  Post procedure Call Back phone  # [phone]  Permission to leave phone message Yes Yes  Some recent data might be hidden     Patient questions:  Do you have a fever, pain , or abdominal swelling? No. Pain Score  0 *  Have you tolerated food without any problems? Yes.    Have you been able to return to your normal activities? Yes.    Do you have any questions about your discharge instructions: Diet   No. Medications  No. Follow up visit  No.  Do you have questions or concerns about your Care? No.  Actions: * If pain score is 4 or above: No action needed, pain <4.  Have you developed a fever since your procedure? no  2.   Have you had an respiratory symptoms (SOB or cough) since your procedure? no  3.   Have you tested positive for COVID 19 since your procedure no  4.   Have you had any family members/close contacts diagnosed with the COVID 19 since your procedure?  no   If yes to any of these questions please route to Laverna Peace, RN and Karlton Lemon, RN

## 2021-09-26 ENCOUNTER — Encounter (HOSPITAL_COMMUNITY): Payer: BC Managed Care – PPO | Admitting: Internal Medicine

## 2021-09-29 ENCOUNTER — Other Ambulatory Visit (HOSPITAL_COMMUNITY): Payer: Self-pay | Admitting: Internal Medicine

## 2021-09-29 ENCOUNTER — Encounter: Payer: Self-pay | Admitting: Gastroenterology

## 2021-10-02 ENCOUNTER — Telehealth: Payer: BC Managed Care – PPO | Admitting: Physician Assistant

## 2021-10-02 DIAGNOSIS — Z298 Encounter for other specified prophylactic measures: Secondary | ICD-10-CM

## 2021-10-02 DIAGNOSIS — Z2989 Encounter for other specified prophylactic measures: Secondary | ICD-10-CM

## 2021-10-02 NOTE — Progress Notes (Signed)
Based on what you shared with me, I feel your condition warrants further evaluation and I recommend that you be seen for a face to face visit.  Please contact your primary care physician practice to be seen. Many offices offer virtual options to be seen via video if you are not comfortable going in person to a medical facility at this time.    Altitude sickness is not a condition we treat or can send in preventive medication for through e-visit or virtual video visit. You would need to contact your PCP or be seen at a local Urgent Care for this.   NOTE: You will NOT be charged for this eVisit.  If you do not have a PCP, [facility] offers a free physician referral service available at [phone]. Our trained staff has the experience, knowledge and resources to put you in touch with a physician who is right for you.    If you are having a true medical emergency please call 911.   Your e-visit answers were reviewed by a board certified advanced clinical practitioner to complete your personal care plan.  Thank you for using e-Visits.

## 2021-10-06 ENCOUNTER — Encounter: Payer: Self-pay | Admitting: Orthopedic Surgery

## 2021-10-06 NOTE — Progress Notes (Signed)
Office Visit Note   Patient: Brandon [name]           Date of Birth: 05-27-1976           MRN: [id number] Visit Date: 09/08/2021              Requested by: Deretha Emory, MD [address] [place],  [place] [address] PCP: Pcp, No  Chief Complaint  Patient presents with   Right Shoulder - Pain      HPI: Patient is a 45 year old gentleman who presents with right shoulder pain for 6 months.  Patient denies any recent injury states he has decreased range of motion with both flexion and external rotation denies any radicular symptoms.  Patient states pain is worse with overhead work and with bench pressing.  Patient is active with mountain biking.  Assessment & Plan: Visit Diagnoses:  1. Chronic right shoulder pain     Plan: Patient was given instructions for internal and external rotation strengthening as well as scapular stabilization.  Subacromial injection if not better at follow-up.  Follow-Up Instructions: Return in about 2 months (around 11/08/2021).   Ortho Exam  Patient is alert, oriented, no adenopathy, well-dressed, normal affect, normal respiratory effort. Examination patient has full range of motion of the right shoulder he does have pain with Neer and Hawkins impingement test [facility] joint is not tender to palpation the biceps tendon is nontender to palpation.  Imaging: No results found. No images are attached to the encounter.  Labs: No results found for: HGBA1C, ESRSEDRATE, CRP, LABURIC, REPTSTATUS, GRAMSTAIN, CULT, LABORGA   No results found for: ALBUMIN, PREALBUMIN, CBC  No results found for: MG No results found for: VD25OH  No results found for: PREALBUMIN CBC EXTENDED Latest Ref Rng & Units 09/20/2010 09/20/2010  WBC 4.0 - 10.5 K/uL - 9.0  RBC 4.22 - 5.81 MIL/uL - 5.26  HGB 13.0 - 17.0 g/dL 17.3(H) 17.1(H)  HCT 39.0 - 52.0 % 51.0 46.3  PLT 150 - 400 K/uL - 173  NEUTROABS 1.7 - 7.7 K/uL - 6.8  LYMPHSABS 0.7 - 4.0 K/uL - 1.2     There  is no height or weight on file to calculate BMI.  Orders:  Orders Placed This Encounter  Procedures   XR Shoulder Right   No orders of the defined types were placed in this encounter.    Procedures: No procedures performed  Clinical Data: No additional findings.  ROS:  All other systems negative, except as noted in the HPI. Review of Systems  Objective: Vital Signs: There were no vitals taken for this visit.  Specialty Comments:  No specialty comments available.  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   Nonallopathic lesion of cervical region 08/08/2019   Nonallopathic lesion of thoracic region 08/08/2019   Nonallopathic lesion of rib cage 08/08/2019   Past Medical History:  Diagnosis Date   Allergy    mold   Dysphasia 07/2020   GERD (gastroesophageal reflux disease)    Hypertension    Migraines    Schatzki's ring 07/2020    Family History  Problem Relation Age of Onset   Colon cancer Neg Hx    Colon polyps Neg Hx    Esophageal cancer Neg Hx    Rectal cancer Neg Hx    Stomach cancer Neg Hx     Past Surgical History:  Procedure Laterality Date   BIOPSY  07/25/2020  Procedure: BIOPSY;  Surgeon: Lemar Lofty., MD;  Location: [facility];  Service: Gastroenterology;;   ESOPHAGOGASTRODUODENOSCOPY (EGD) WITH PROPOFOL N/A 07/25/2020   Procedure: ESOPHAGOGASTRODUODENOSCOPY (EGD) WITH PROPOFOL;  Surgeon: Lemar Lofty., MD;  Location: [facility];  Service: Gastroenterology;  Laterality: N/A;   FOREIGN BODY REMOVAL  07/25/2020   Procedure: FOREIGN BODY REMOVAL;  Surgeon: Meridee Score, Netty Starring., MD;  Location: [facility];  Service: Gastroenterology;;   UPPER GASTROINTESTINAL ENDOSCOPY     WISDOM TOOTH EXTRACTION     Social History   Occupational History   Not on file  Tobacco Use   Smoking status: Never   Smokeless tobacco: Never  Vaping Use   Vaping Use: Never used   Substance and Sexual Activity   Alcohol use: Yes    Alcohol/week: 1.0 standard drink    Types: 1 Standard drinks or equivalent per week   Drug use: Never   Sexual activity: Yes

## 2021-11-17 ENCOUNTER — Other Ambulatory Visit: Payer: Self-pay

## 2021-11-17 ENCOUNTER — Ambulatory Visit (HOSPITAL_COMMUNITY)
Admission: RE | Admit: 2021-11-17 | Discharge: 2021-11-17 | Disposition: A | Discharge status: Home / Self Care | Payer: BC Managed Care – PPO | Source: Ambulatory Visit | Attending: Internal Medicine | Admitting: Internal Medicine

## 2021-11-17 ENCOUNTER — Encounter (HOSPITAL_COMMUNITY): Payer: Self-pay | Admitting: Internal Medicine

## 2021-11-17 VITALS — BP 140/82 | HR 51 | Wt 186.4 lb

## 2021-11-17 DIAGNOSIS — Z8249 Family history of ischemic heart disease and other diseases of the circulatory system: Secondary | ICD-10-CM | POA: Diagnosis not present

## 2021-11-17 DIAGNOSIS — I493 Ventricular premature depolarization: Secondary | ICD-10-CM | POA: Insufficient documentation

## 2021-11-17 DIAGNOSIS — I10 Essential (primary) hypertension: Secondary | ICD-10-CM | POA: Insufficient documentation

## 2021-11-17 DIAGNOSIS — Z136 Encounter for screening for cardiovascular disorders: Secondary | ICD-10-CM

## 2021-11-17 DIAGNOSIS — E785 Hyperlipidemia, unspecified: Secondary | ICD-10-CM | POA: Diagnosis not present

## 2021-11-17 LAB — COMPREHENSIVE METABOLIC PANEL
ALT: 35 U/L (ref 0–44)
AST: 26 U/L (ref 15–41)
Albumin: 4.4 g/dL (ref 3.5–5.0)
Alkaline Phosphatase: 49 U/L (ref 38–126)
Anion gap: 8 (ref 5–15)
BUN: 12 mg/dL (ref 6–20)
CO2: 26 mmol/L (ref 22–32)
Calcium: 9.4 mg/dL (ref 8.9–10.3)
Chloride: 102 mmol/L (ref 98–111)
Creatinine, Ser: 1.03 mg/dL (ref 0.61–1.24)
GFR, Estimated: >60 mL/min (ref >60)
Glucose, Bld: 87 mg/dL (ref 70–99)
Potassium: 4.2 mmol/L (ref 3.5–5.1)
Sodium: 136 mmol/L (ref 135–145)
Total Bilirubin: 0.9 mg/dL (ref 0.3–1.2)
Total Protein: 7.2 g/dL (ref 6.5–8.1)

## 2021-11-17 LAB — LIPID PANEL
Cholesterol: 264 mg/dL — ABNORMAL HIGH (ref 0–200)
HDL: 67 mg/dL (ref >40)
LDL Cholesterol: 175 mg/dL — ABNORMAL HIGH (ref 0–99)
Total CHOL/HDL Ratio: 3.9 RATIO
Triglycerides: 112 mg/dL (ref <150)
VLDL: 22 mg/dL (ref 0–40)

## 2021-11-17 LAB — CBC
HCT: 46.9 % (ref 39.0–52.0)
Hemoglobin: 16.9 g/dL (ref 13.0–17.0)
MCH: 32.2 pg (ref 26.0–34.0)
MCHC: 36 g/dL (ref 30.0–36.0)
MCV: 89.3 fL (ref 80.0–100.0)
Platelets: 198 10*3/uL (ref 150–400)
RBC: 5.25 MIL/uL (ref 4.22–5.81)
RDW: 11.9 % (ref 11.5–15.5)
WBC: 5.4 10*3/uL (ref 4.0–10.5)
nRBC: 0 % (ref 0.0–0.2)

## 2021-11-17 LAB — TSH: TSH: 1.475 u[IU]/mL (ref 0.350–4.500)

## 2021-11-17 LAB — PSA: Prostatic Specific Antigen: 0.94 ng/mL (ref 0.00–4.00)

## 2021-11-17 NOTE — Patient Instructions (Signed)
Medication Changes:  No change  Lab Work:  Labs done today, your results will be available in MyChart, we will contact you for abnormal readings.   Testing/Procedures:  Coronary Calcium CT. They will call you to schedule the appointment  Referrals:  none  Special Instructions // Education:    Follow-Up in: 1 year (December 2023)  ** Call in October to make appointment.  At the [facility], you and your health needs are our priority. We have a designated team specialized in the treatment of Heart Failure. This Care Team includes your primary Heart Failure Specialized Cardiologist (physician), Advanced Practice Providers (APPs- Physician Assistants and Nurse Practitioners), and Pharmacist who all work together to provide you with the care you need, when you need it.   You may see any of the following providers on your designated Care Team at your next follow up:  Dr Arvilla Meres Dr Carron Curie, NP Robbie Lis, [place] [facility] [place], [place] Karle Plumber, PharmD   Please be sure to bring in all your medications bottles to every appointment.   Need to Contact us:  If you have any questions or concerns before your next appointment please send [place] a message through [place] or call our office at [phone].    TO LEAVE A MESSAGE FOR THE NURSE SELECT OPTION 2, PLEASE LEAVE A MESSAGE INCLUDING: YOUR NAME DATE OF BIRTH CALL BACK NUMBER REASON FOR CALL**this is important as we prioritize the call backs  YOU WILL RECEIVE A CALL BACK THE SAME DAY AS LONG AS YOU CALL BEFORE 4:00 PM

## 2021-11-17 NOTE — Progress Notes (Addendum)
CARDIOLOGY CLINIC NOTE  Referring Physician: K. Reche Dixon   HPI: Brandon [name] is a 45 y/o C[employer] for [employer] with a h/o migraines and borderline HTN. Referred by Dr. Jeronimo Greaves for further evaluation of his HTN.   In 2010 was told he had high blood pressure. He subsequently lost 30 pounds by mountain biking and running. BP got better but never got back to normal. Has very strong FHx of HTN in both parents.   Father has had extensive w/u for HTN but no clear cause.   Today he returns for HF follow up.Overall feeling fine. Exercising 10 hours per week. Denies SOB/PND/Orthopnea. Appetite ok. No fever or chills. Taking all medications.    Past Medical History:  Diagnosis Date   Allergy    mold   Dysphasia 07/2020   GERD (gastroesophageal reflux disease)    Hypertension    Migraines    Schatzki's ring 07/2020    Current Outpatient Medications  Medication Sig Dispense Refill   ibuprofen (ADVIL,MOTRIN) 200 MG tablet Take 400 mg by mouth every 6 (six) hours as needed for headache or moderate pain.     losartan (COZAAR) 25 MG tablet TAKE 1 TABLET BY MOUTH EVERY DAY. please make appt FOR further refills 30 tablet 2   omeprazole (PRILOSEC) 40 MG capsule TAKE 1 CAPSULE BY MOUTH 2 TIMES DAILY BEFORE A MEAL 60 capsule 5   No current facility-administered medications for this encounter.    Allergies  Allergen Reactions   Penicillins       Social History   Socioeconomic History   Marital status: Married    Spouse name: Not on file   Number of children: Not on file   Years of education: Not on file   Highest education level: Not on file  Occupational History   Not on file  Tobacco Use   Smoking status: Never   Smokeless tobacco: Never  Vaping Use   Vaping Use: Never used  Substance and Sexual Activity   Alcohol use: Yes    Alcohol/week: 1.0 standard drink    Types: 1 Standard drinks or equivalent per week   Drug use: Never   Sexual activity: Yes  Other Topics Concern    Not on file  Social History Narrative   Not on file   Social Determinants of Health   Financial Resource Strain: Not on file  Food Insecurity: Not on file  Transportation Needs: Not on file  Physical Activity: Not on file  Stress: Not on file  Social Connections: Not on file  Intimate Partner Violence: Not on file   FHX: Mom HTN Dad HTN and Afib/AFL s/p ablation Only child MGMs both with HTN MGF died MI  Vitals:   2021/12/07 1144  BP: 140/82  Pulse: (!) 51  SpO2: 96%  Weight: 84.6 kg (186 lb 6.4 oz)    PHYSICAL EXAM: General:  Well appearing. No resp difficulty HEENT: normal Neck: supple. no JVD. Carotids 2+ bilat; no bruits. No lymphadenopathy or thryomegaly appreciated. Cor: PMI nondisplaced. Regular rate & rhythm. No rubs, gallops or murmurs. Lungs: clear Abdomen: soft, nontender, nondistended. No hepatosplenomegaly. No bruits or masses. Good bowel sounds. Extremities: no cyanosis, clubbing, rash, edema Neuro: alert & orientedx3, cranial nerves grossly intact. moves all 4 extremities w/o difficulty. Affect pleasant  ECG:  Sinus Brady 50 bpm with early repol. Personally reviewed   ASSESSMENT & PLAN:  1) HTN  - Continue current dose of losartan.   2) Frequent PVCs on ECG - decreased  with less stress. - No PVCs on EKG   3) Cardiac risk stratification - Needs calcium score.   Tonye Becket, NP  12:16 PM  Patient seen and examined with the above-signed Advanced Practice Provider and/or Housestaff. I personally reviewed laboratory data, imaging studies and relevant notes. I independently examined the patient and formulated the important aspects of the plan. I have edited the note to reflect any of my changes or salient points. I have personally discussed the plan with the patient and/or family.  Doing well. Remains active with cycling 7-10 hrs/week. Does a race ride on [place] every Tuesday for 1 hr at HR 170+. No CP or SOB.   BP at home mostly 120s/80-90.    General:  Well appearing. No resp difficulty HEENT: normal Neck: supple. no JVD. Carotids 2+ bilat; no bruits. No lymphadenopathy or thryomegaly appreciated. Cor: PMI nondisplaced. Regular rate & rhythm. No rubs, gallops or murmurs. Lungs: clear Abdomen: soft, nontender, nondistended. No hepatosplenomegaly. No bruits or masses. Good bowel sounds. Extremities: no cyanosis, clubbing, rash, edema Neuro: alert & orientedx3, cranial nerves grossly intact. moves all 4 extremities w/o difficulty. Affect pleasant  Doing well. BP looks good. Will get coronary calcium score and lipid panel for further risk stratification. Check labs today.  Arvilla Meres, MD  12:38 PM

## 2021-11-21 ENCOUNTER — Telehealth (HOSPITAL_COMMUNITY): Payer: Self-pay | Admitting: Vascular Surgery

## 2021-11-21 NOTE — Addendum Note (Signed)
Encounter addended by: Linda Hedges, RN on: 11/21/2021 10:22 AM  Actions taken: Order list changed, Diagnosis association updated

## 2021-11-21 NOTE — Telephone Encounter (Signed)
Left pt message giving Calcium score appt 12/24/21 @ 8:45

## 2021-12-24 ENCOUNTER — Other Ambulatory Visit: Payer: BC Managed Care – PPO

## 2022-01-02 ENCOUNTER — Other Ambulatory Visit (HOSPITAL_COMMUNITY): Payer: Self-pay | Admitting: Internal Medicine

## 2022-01-05 ENCOUNTER — Other Ambulatory Visit: Payer: Self-pay

## 2022-01-05 ENCOUNTER — Ambulatory Visit (INDEPENDENT_AMBULATORY_CARE_PROVIDER_SITE_OTHER)
Admission: RE | Admit: 2022-01-05 | Discharge: 2022-01-05 | Disposition: A | Discharge status: Home / Self Care | Payer: Self-pay | Source: Ambulatory Visit | Attending: Internal Medicine | Admitting: Internal Medicine

## 2022-01-05 DIAGNOSIS — Z136 Encounter for screening for cardiovascular disorders: Secondary | ICD-10-CM

## 2022-04-10 IMAGING — CT CT CARDIAC CORONARY ARTERY CALCIUM SCORE
3 series · 14 of 20 positions shown, 16 images · non-contrast
Comparison: Chest CT 09/20/2010.
COMPARISON: Chest CT 09/20/2010.

Addendum:
EXAM:
OVER-READ INTERPRETATION  CT CHEST

The following report is an over-read performed by radiologist Dr.
Nezavisna Lista Gabela [REDACTED] on 01/05/2022. This over-read
does not include interpretation of cardiac or coronary anatomy or
pathology. The calcium score interpretation by the cardiologist is
attached.
CLINICAL DATA: Cardiovascular Disease Risk stratification
Coronary Calcium Score
TECHNIQUE: A gated, non-contrast computed tomography scan of the heart was
performed using 3mm slice thickness. Axial images were analyzed on a
dedicated workstation. Calcium scoring of the coronary arteries was
performed using the Agatston method.

[Series 2: cascseq 2.0 sa36 70% (id) · axial · 0.40mm/px · z∈[-266,-164]mm · 4 of 85 slices shown]
[im 17/85  vessel]
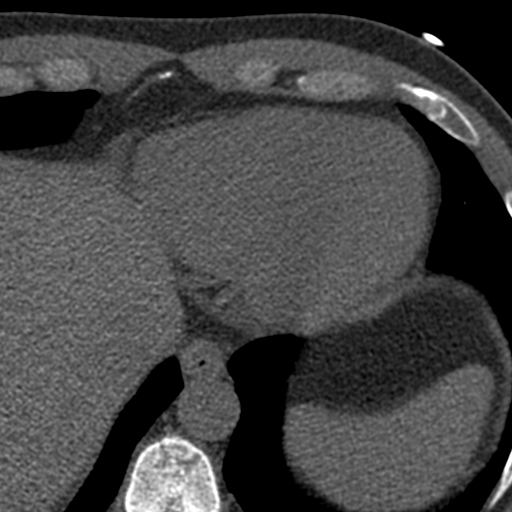
[im 34/85  vessel]
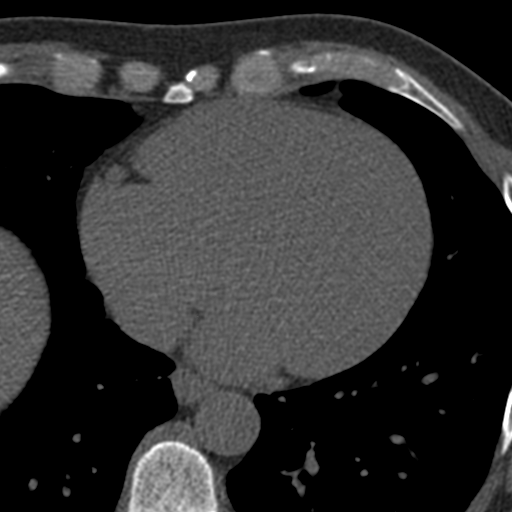
[im 51/85  vessel]
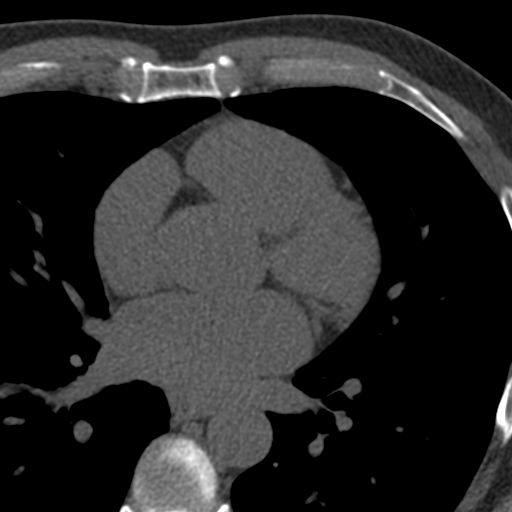
[im 68/85  vessel]
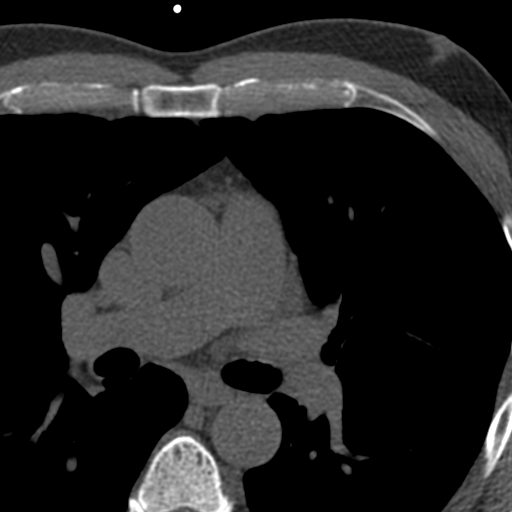

[Series 3: cascseq 2.0 bf37 st · axial · 0.74mm/px · z∈[-270,-158]mm · 5 of 85 slices shown, 7 images]
[im 15/85  vessel]
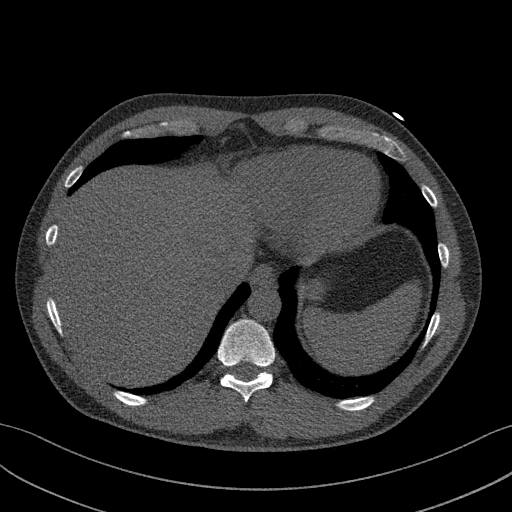
[im 15/85  lung]
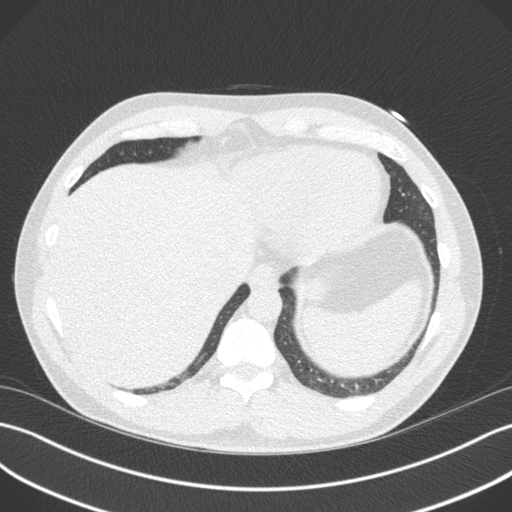
[im 29/85  vessel]
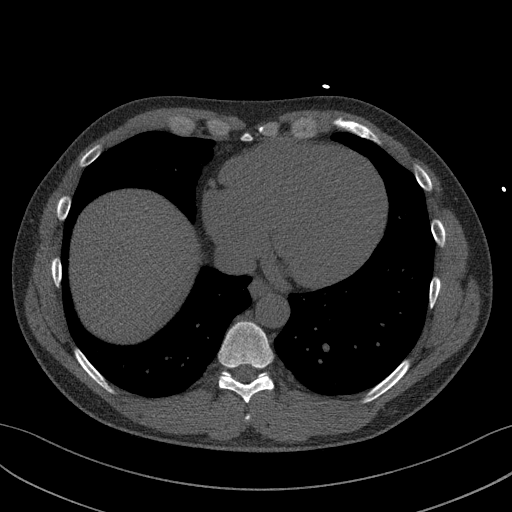
[im 43/85  vessel]
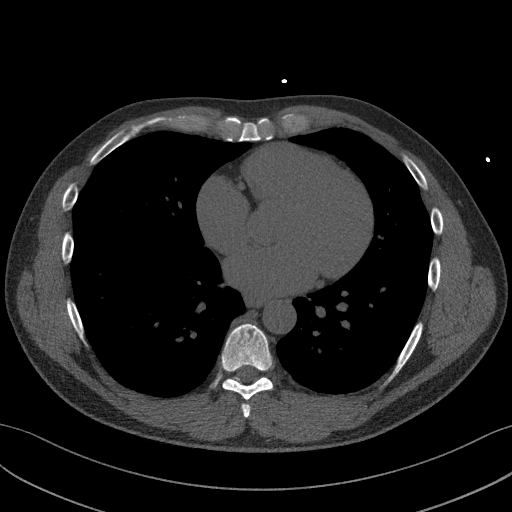
[im 57/85  vessel]
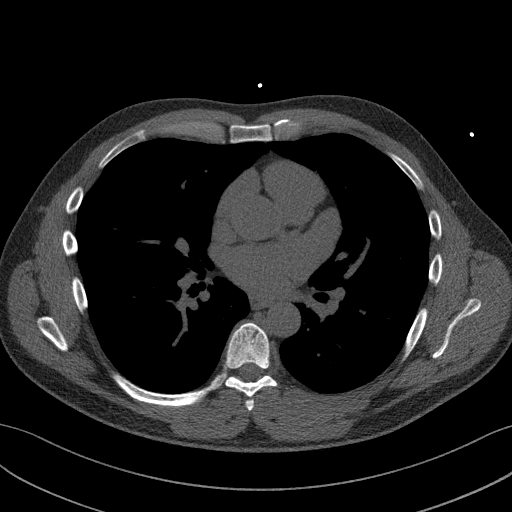
[im 71/85  vessel]
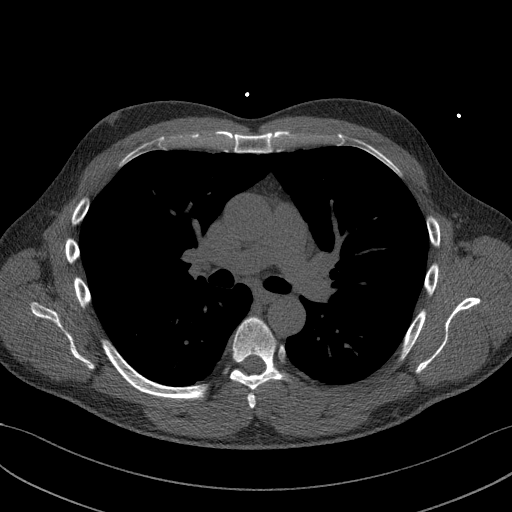
[im 71/85  lung]
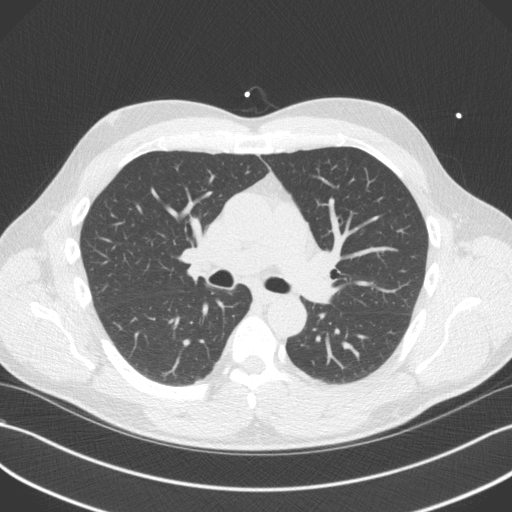

[Series 4: cascseq 2.0 br59 lung · axial · 0.74mm/px · z∈[-270,-158]mm · 5 of 85 slices shown]
[im 15/85  lung]
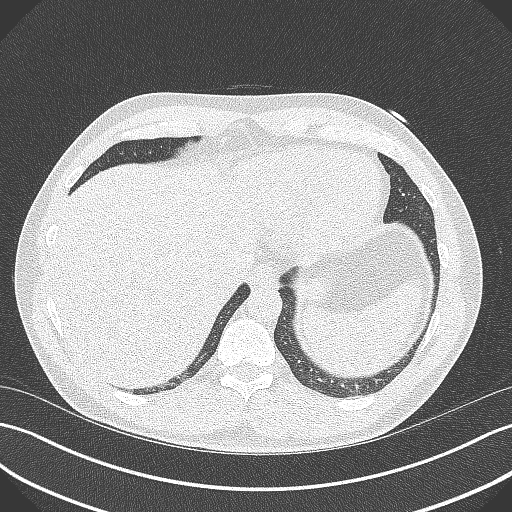
[im 29/85  lung]
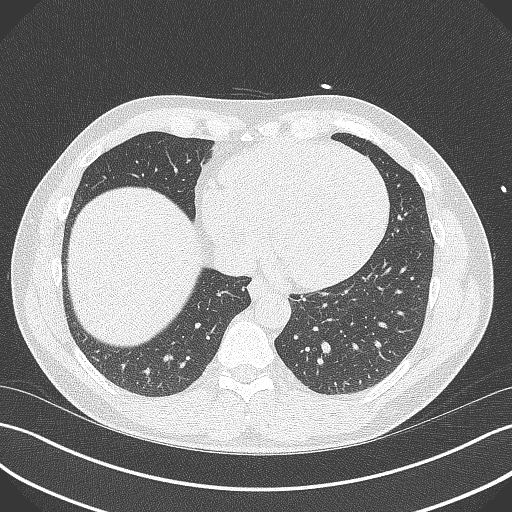
[im 43/85  lung]
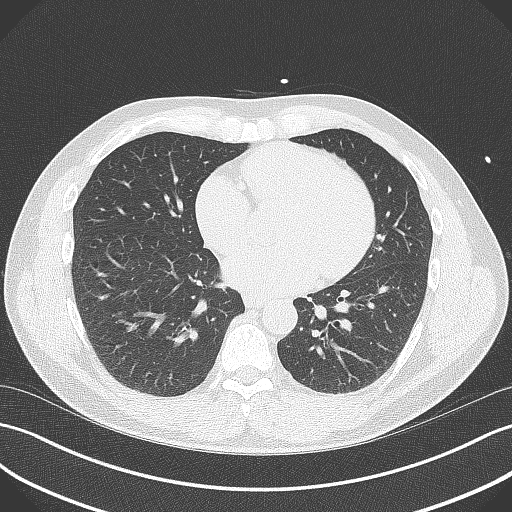
[im 57/85  lung]
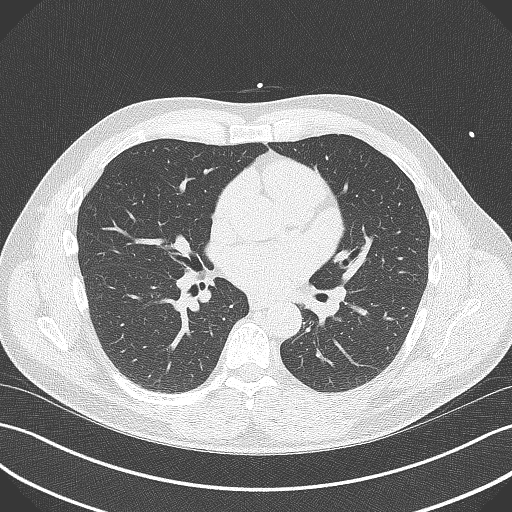
[im 71/85  lung]
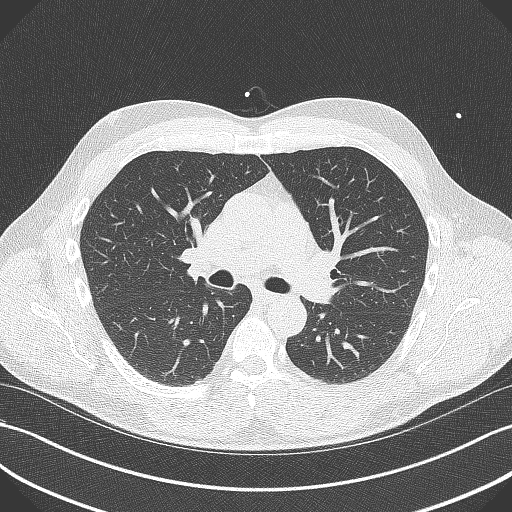

[14 of 20 positions shown; findings below may reference images not displayed]

FINDINGS: Vascular: Minimal transverse aortic atherosclerosis on [DATE].

Mediastinum/Nodes: No imaged thoracic adenopathy.

Lungs/Pleura: No pleural fluid. 3 mm subpleural lymph node along the
left major fissure on 54/4 is similar to on 43/3 of the prior exam.

Upper Abdomen: Normal imaged portions of the spleen, stomach.
Possible 4 mm hepatic dome cyst on 67/3.

Musculoskeletal: No acute osseous abnormality. Mild right
hemidiaphragm elevation.
IMPRESSION: No acute findings in the imaged extracardiac chest.

Aortic Atherosclerosis (V2PAR-3CJ.J).
FINDINGS: Coronary Calcium Score:

Left main: 0

Left anterior descending artery: 0

Left circumflex artery: 0

Right coronary artery: 0

Total: 0

Percentile: NA

Pericardium: Normal.

Non-cardiac: See separate report from [REDACTED].
IMPRESSION: Coronary calcium score of 0.



If CAC=0, it is reasonable to withhold statin therapy and reassess
in 5 to 10 years, as long as higher risk conditions are absent
(diabetes mellitus, family history of premature CHD in first degree
relatives (males <55 years; females <65 years), cigarette smoking,
or LDL >=190 mg/dL).

If CAC is 1 to 99, it is reasonable to initiate statin therapy for
patients >=55 years of age.

If CAC is >=100 or >=75th percentile, it is reasonable to initiate
statin therapy at any age.

Cardiology referral should be considered for patients with CAC
scores >=400 or >=75th percentile.

*0555 AHA/ACC/AACVPR/AAPA/ABC/FARNESI/MIIRANDA/DIMUHAU/Sravya/RV/ABSOLON/TAKUHIRO
Guideline on the Management of Blood Cholesterol: A Report of the
American College of Cardiology/American Heart Association Task Force
on Clinical Practice Guidelines. J Am Coll Cardiol.
6608;73(24):2984-2256.

*** End of Addendum ***
EXAM:
OVER-READ INTERPRETATION  CT CHEST

The following report is an over-read performed by radiologist Dr.
Nezavisna Lista Gabela [REDACTED] on 01/05/2022. This over-read
does not include interpretation of cardiac or coronary anatomy or
pathology. The calcium score interpretation by the cardiologist is
attached.
FINDINGS: Vascular: Minimal transverse aortic atherosclerosis on [DATE].

Mediastinum/Nodes: No imaged thoracic adenopathy.

Lungs/Pleura: No pleural fluid. 3 mm subpleural lymph node along the
left major fissure on 54/4 is similar to on 43/3 of the prior exam.

Upper Abdomen: Normal imaged portions of the spleen, stomach.
Possible 4 mm hepatic dome cyst on 67/3.

Musculoskeletal: No acute osseous abnormality. Mild right
hemidiaphragm elevation.
IMPRESSION: No acute findings in the imaged extracardiac chest.

Aortic Atherosclerosis (V2PAR-3CJ.J).

## 2022-04-13 ENCOUNTER — Telehealth: Payer: BC Managed Care – PPO | Admitting: Physician Assistant

## 2022-04-13 DIAGNOSIS — F4323 Adjustment disorder with mixed anxiety and depressed mood: Secondary | ICD-10-CM | POA: Diagnosis not present

## 2022-04-13 MED ORDER — HYDROXYZINE PAMOATE 25 MG PO CAPS
25.0000 mg | ORAL_CAPSULE | Freq: Three times a day (TID) | ORAL | 0 refills | Status: DC | PRN
Start: 2022-04-13 — End: 2022-06-24

## 2022-04-13 MED ORDER — SERTRALINE HCL 50 MG PO TABS: 50.0000 mg | ORAL_TABLET | Freq: Every day | ORAL | 0 refills | Status: DC

## 2022-04-13 NOTE — Patient Instructions (Signed)
?Brandon [name], thank you for joining Mar Daring, PA-C for today's virtual visit.  While this provider is not your primary care provider (PCP), if your PCP is located in our provider database this encounter information will be shared with them immediately following your visit. ? ?Consent: ?(Patient) Brandon [name] provided verbal consent for this virtual visit at the beginning of the encounter. ? ?Current Medications: ? ?Current Outpatient Medications:  ?  hydrOXYzine (VISTARIL) 25 MG capsule, Take 1 capsule (25 mg total) by mouth every 8 (eight) hours as needed., Disp: 30 capsule, Rfl: 0 ?  sertraline (ZOLOFT) 50 MG tablet, Take 1 tablet (50 mg total) by mouth daily., Disp: 90 tablet, Rfl: 0 ?  ibuprofen (ADVIL,MOTRIN) 200 MG tablet, Take 400 mg by mouth every 6 (six) hours as needed for headache or moderate pain., Disp: , Rfl:  ?  losartan (COZAAR) 25 MG tablet, TAKE 1 TABLET BY MOUTH EVERY DAY, Disp: 30 tablet, Rfl: 2 ?  omeprazole (PRILOSEC) 40 MG capsule, TAKE 1 CAPSULE BY MOUTH 2 TIMES DAILY BEFORE A MEAL, Disp: 60 capsule, Rfl: 5  ? ?Medications ordered in this encounter:  ?Meds ordered this encounter  ?Medications  ? sertraline (ZOLOFT) 50 MG tablet  ?  Sig: Take 1 tablet (50 mg total) by mouth daily.  ?  Dispense:  90 tablet  ?  Refill:  0  ?  Order Specific Question:   Supervising Provider  ?  Answer:   Noemi Chapel [[id number]]  ? hydrOXYzine (VISTARIL) 25 MG capsule  ?  Sig: Take 1 capsule (25 mg total) by mouth every 8 (eight) hours as needed.  ?  Dispense:  30 capsule  ?  Refill:  0  ?  Order Specific Question:   Supervising Provider  ?  Answer:   Noemi Chapel [[id number]]  ?  ? ?*If you need refills on other medications prior to your next appointment, please contact your pharmacy* ? ?Follow-Up: ?Call back or seek an in-person evaluation if the symptoms worsen or if the condition fails to improve as anticipated. ? ?Other Instructions ?Sertraline Tablets ?What is this medication? ?SERTRALINE (SER tra  leen) treats depression, anxiety, obsessive-compulsive disorder (OCD), post-traumatic stress disorder (PTSD), and premenstrual dysphoric disorder (PMDD). It increases the amount of serotonin in the brain, a hormone that helps regulate mood. It belongs to a group of medications called SSRIs. ?This medicine may be used for other purposes; ask your health care provider or pharmacist if you have questions. ?COMMON BRAND NAME(S): Zoloft ?What should I tell my care team before I take this medication? ?They need to know if you have any of these conditions: ?Bleeding disorders ?Bipolar disorder or a family history of bipolar disorder ?Frequently drink alcohol ?Glaucoma ?Heart disease ?High blood pressure ?History of irregular heartbeat ?History of low levels of calcium, magnesium, or potassium in the blood ?Liver disease ?Receiving electroconvulsive therapy ?Seizures ?Suicidal thoughts, plans, or attempt; a previous suicide attempt by you or a family member ?Take medications that prevent or treat blood clots ?Thyroid disease ?An unusual or allergic reaction to sertraline, other medications, foods, dyes, or preservatives ?Pregnant or trying to get pregnant ?Breast-feeding ?How should I use this medication? ?Take this medication by mouth with a glass of water. Follow the directions on the prescription label. You can take it with or without food. Take your medication at regular intervals. Do not take your medication more often than directed. Do not stop taking this medication suddenly except upon the advice of your care team.  Stopping this medication too quickly may cause serious side effects or your condition may worsen. ?A special MedGuide will be given to you by the pharmacist with each prescription and refill. Be sure to read this information carefully each time. ?Talk to your care team about the use of this medication in children. While this medication may be prescribed for children as young as 7 years for selected  conditions, precautions do apply. ?Overdosage: If you think you have taken too much of this medicine contact a poison control center or emergency room at once. ?NOTE: This medicine is only for you. Do not share this medicine with others. ?What if I miss a dose? ?If you miss a dose, take it as soon as you can. If it is almost time for your next dose, take only that dose. Do not take double or extra doses. ?What may interact with this medication? ?Do not take this medication with any of the following: ?Cisapride ?Dronedarone ?Linezolid ?MAOIs like Carbex, Eldepryl, Marplan, Nardil, and Parnate ?Methylene blue (injected into a vein) ?Pimozide ?Thioridazine ?This medication may also interact with the following: ?Alcohol ?Amphetamines ?Aspirin and aspirin-like medications ?Certain medications for depression, anxiety, or other mental health conditions ?Certain medications for fungal infections like ketoconazole, fluconazole, posaconazole, and itraconazole ?Certain medications for irregular heart beat like flecainide, quinidine, propafenone ?Certain medications for migraine headaches like almotriptan, eletriptan, frovatriptan, naratriptan, rizatriptan, sumatriptan, zolmitriptan ?Certain medications for sleep ?Certain medications for seizures like carbamazepine, valproic acid, phenytoin ?Certain medications that treat or prevent blood clots like warfarin, enoxaparin, dalteparin ?Cimetidine ?Digoxin ?Diuretics ?Fentanyl ?Isoniazid ?Lithium ?NSAIDs, medications for pain and inflammation, like ibuprofen or naproxen ?Other medications that prolong the QT interval (cause an abnormal heart rhythm) like dofetilide ?Rasagiline ?Safinamide ?Supplements like St. John's wort, kava kava, valerian ?Tolbutamide ?Tramadol ?Tryptophan ?This list may not describe all possible interactions. Give your health care provider a list of all the medicines, herbs, non-prescription drugs, or dietary supplements you use. Also tell them if you smoke,  drink alcohol, or use illegal drugs. Some items may interact with your medicine. ?What should I watch for while using this medication? ?Tell your care team if your symptoms do not get better or if they get worse. Visit your care team for regular checks on your progress. Because it may take several weeks to see the full effects of this medication, it is important to continue your treatment as prescribed by your care team. ?Patients and their families should watch out for new or worsening thoughts of suicide or depression. Also watch out for sudden changes in feelings such as feeling anxious, agitated, panicky, irritable, hostile, aggressive, impulsive, severely restless, overly excited and hyperactive, or not being able to sleep. If this happens, especially at the beginning of treatment or after a change in dose, call your care team. ?This medication may affect your coordination, reaction time, or judgment. Do not drive or operate machinery until you know how this medication affects you. Sit or stand up slowly to reduce the risk of dizzy or fainting spells. Drinking alcohol with this medication can increase the risk of these side effects. ?Your mouth may get dry. Chewing sugarless gum or sucking hard candy, and drinking plenty of water may help. Contact your care team if the problem does not go away or is severe. ?What side effects may I notice from receiving this medication? ?Side effects that you should report to your care team as soon as possible: ?Allergic reactions--skin rash, itching, hives, swelling of the face, lips,  tongue, or throat ?Bleeding--bloody or black, tar-like stools, red or dark brown urine, vomiting blood or brown material that looks like coffee grounds, small red or purple spots on skin, unusual bleeding or bruising ?Heart rhythm changes--fast or irregular heartbeat, dizziness, feeling faint or lightheaded, chest pain, trouble breathing ?Low sodium level--muscle weakness, fatigue, dizziness,  headache, confusion ?Serotonin syndrome--irritability, confusion, fast or irregular heartbeat, muscle stiffness, twitching muscles, sweating, high fever, seizure, chills, vomiting, diarrhea ?Sudden eye pain or c

## 2022-04-13 NOTE — Progress Notes (Signed)
?Virtual Visit Consent  ? ?Brandon [name], you are scheduled for a virtual visit with a [facility] provider today.   ?  ?Just as with appointments in the office, your consent must be obtained to participate.  Your consent will be active for this visit and any virtual visit you may have with one of our providers in the next 365 days.   ?  ?If you have a MyChart account, a copy of this consent can be sent to you electronically.  All virtual visits are billed to your insurance company just like a traditional visit in the office.   ? ?As this is a virtual visit, video technology does not allow for your provider to perform a traditional examination.  This may limit your provider's ability to fully assess your condition.  If your provider identifies any concerns that need to be evaluated in person or the need to arrange testing (such as labs, EKG, etc.), we will make arrangements to do so.   ?  ?Although advances in technology are sophisticated, we cannot ensure that it will always work on either your end or our end.  If the connection with a video visit is poor, the visit may have to be switched to a telephone visit.  With either a video or telephone visit, we are not always able to ensure that we have a secure connection.   ? ?Also, by engaging in this virtual visit, you consent to the provision of healthcare. Additionally, you authorize for your insurance to be billed (if applicable) for the services provided during this visit. I also discussed with the patient that there may be a patient responsible charge related to this service. ? ?I need to obtain your verbal consent now.   Are you willing to proceed with your visit today?  ?  ?Jehad Baack has provided verbal consent on 04/13/2022 for a virtual visit (video or telephone). ?  ?Mar Daring, PA-C  ? ?Date: 04/13/2022 5:43 PM ? ? ?Virtual Visit via Video Note  ? ?Brandon [name], connected with  Brandon [name]  ([id number], [birth year]) on 04/13/22 at  5:30  PM EDT by a video-enabled telemedicine application and verified that I am speaking with the correct person using two identifiers. ? ?Location: ?Patient: Virtual Visit Location Patient: Home ?Provider: Virtual Visit Location Provider: Home Office ?  ?I discussed the limitations of evaluation and management by telemedicine and the availability of in person appointments. The patient expressed understanding and agreed to proceed.   ? ?History of Present Illness: ?Brandon [name] is a 46 y.o. who identifies as a male who was assigned male at birth, and is being seen today for anxiety. Has had worsening anxiety recently. Having lots of work stress and also having some familial stress. There are some health concerns with his mother as well as some other issues. He is having issues with sleep, racing thoughts, excessive worry. He has been seeking counseling through employer as well as personal. They recommend for him to discuss treatment for anxiety. He does have a new patient appt with PCP, but it is not until July 2023 (seen in [place]). He does not think he can wait until July before starting medications.  ? ? ?Problems:  ?Patient Active Problem List  ? Diagnosis Date Noted  ? 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 Nonallopathic lesion of cervical region 08/08/2019  ? Nonallopathic lesion of thoracic region 08/08/2019  ? Nonallopathic lesion of rib cage 08/08/2019  ?  ?Allergies:  ?Allergies  ?  Allergen Reactions  ? Penicillins   ? ?Medications:  ?Current Outpatient Medications:  ?  hydrOXYzine (VISTARIL) 25 MG capsule, Take 1 capsule (25 mg total) by mouth every 8 (eight) hours as needed., Disp: 30 capsule, Rfl: 0 ?  sertraline (ZOLOFT) 50 MG tablet, Take 1 tablet (50 mg total) by mouth daily., Disp: 90 tablet, Rfl: 0 ?  ibuprofen (ADVIL,MOTRIN) 200 MG tablet, Take 400 mg by mouth every 6 (six) hours as needed for headache or moderate pain., Disp: , Rfl:  ?  losartan (COZAAR) 25 MG tablet, TAKE 1 TABLET BY MOUTH EVERY DAY, Disp:  30 tablet, Rfl: 2 ?  omeprazole (PRILOSEC) 40 MG capsule, TAKE 1 CAPSULE BY MOUTH 2 TIMES DAILY BEFORE A MEAL, Disp: 60 capsule, Rfl: 5 ? ?Observations/Objective: ?Patient is well-developed, well-nourished in no acute distress.  ?Resting comfortably at home.  ?Head is normocephalic, atraumatic.  ?No labored breathing.  ?Speech is clear and coherent with logical content.  ?Patient is alert and oriented at baseline.  ? ? ?Assessment and Plan: ?1. Situational mixed anxiety and depressive disorder ?- sertraline (ZOLOFT) 50 MG tablet; Take 1 tablet (50 mg total) by mouth daily.  Dispense: 90 tablet; Refill: 0 ?- hydrOXYzine (VISTARIL) 25 MG capsule; Take 1 capsule (25 mg total) by mouth every 8 (eight) hours as needed.  Dispense: 30 capsule; Refill: 0 ? ?- Will start Sertraline, start 1/2 tab x 1 week, then increase to 1 tab ?- Hydroxyzine for prn use as needed, can use at bedtime ?- Advised patient that he can reach back out to our department if he has side effects or issues prior to establishing with Dr. Ronnald Ramp ? ?Follow Up Instructions: ?I discussed the assessment and treatment plan with the patient. The patient was provided an opportunity to ask questions and all were answered. The patient agreed with the plan and demonstrated an understanding of the instructions.  A copy of instructions were sent to the patient via MyChart unless otherwise noted below.  ? ? ?The patient was advised to call back or seek an in-person evaluation if the symptoms worsen or if the condition fails to improve as anticipated. ? ?Time:  ?I spent 18 minutes with the patient via telehealth technology discussing the above problems/concerns.   ? ?Mar Daring, PA-C ?

## 2022-04-14 ENCOUNTER — Other Ambulatory Visit (HOSPITAL_COMMUNITY): Payer: Self-pay | Admitting: Internal Medicine

## 2022-05-15 ENCOUNTER — Other Ambulatory Visit: Payer: Self-pay | Admitting: Gastroenterology

## 2022-06-22 ENCOUNTER — Ambulatory Visit (INDEPENDENT_AMBULATORY_CARE_PROVIDER_SITE_OTHER): Payer: BC Managed Care – PPO | Admitting: Internal Medicine

## 2022-06-22 ENCOUNTER — Encounter: Payer: Self-pay | Admitting: Internal Medicine

## 2022-06-22 VITALS — BP 140/90 | HR 46 | Temp 98.5°F | Ht 70.0 in | Wt 190.0 lb

## 2022-06-22 DIAGNOSIS — Z23 Encounter for immunization: Secondary | ICD-10-CM

## 2022-06-22 DIAGNOSIS — I10 Essential (primary) hypertension: Secondary | ICD-10-CM | POA: Diagnosis not present

## 2022-06-22 DIAGNOSIS — S80819A Abrasion, unspecified lower leg, initial encounter: Secondary | ICD-10-CM | POA: Insufficient documentation

## 2022-06-22 DIAGNOSIS — F4323 Adjustment disorder with mixed anxiety and depressed mood: Secondary | ICD-10-CM | POA: Insufficient documentation

## 2022-06-22 MED ORDER — SERTRALINE HCL 50 MG PO TABS: 50.0000 mg | ORAL_TABLET | Freq: Every day | ORAL | 1 refills | Status: DC

## 2022-06-22 MED ORDER — OLMESARTAN MEDOXOMIL 20 MG PO TABS: 20.0000 mg | ORAL_TABLET | Freq: Every day | ORAL | 1 refills | Status: DC

## 2022-06-22 NOTE — Progress Notes (Unsigned)
Subjective:  Patient ID: Brandon [name], male    DOB: 11-30-1976  Age: 46 y.o. MRN: [id number]  CC: Hypertension   HPI Seymour Pavlak presents for establishing -  His blood pressure at home is consistently above 140/90 despite taking 25 mg of losartan a day.  He does long distance endurance cardiovascular exercise and does not experience palpitations, dizziness, lightheadedness, near-syncope, or edema.  He has some healing abrasions on both lower extremities that are asymptomatic.  History Aziah has a past medical history of Allergy, Dysphasia (07/2020), GERD (gastroesophageal reflux disease), Hypertension, Migraines, and Schatzki's ring (07/2020).   He has a past surgical history that includes Wisdom tooth extraction; Esophagogastroduodenoscopy (egd) with propofol (N/A, 07/25/2020); Foreign Body Removal (07/25/2020); biopsy (07/25/2020); and Upper gastrointestinal endoscopy.   His family history includes Hypertension in his father and mother.He reports that he has never smoked. He has never used smokeless tobacco. He reports current alcohol use of about 2.0 standard drinks of alcohol per week. He reports that he does not use drugs.  Outpatient Medications Prior to Visit  Medication Sig Dispense Refill   omeprazole (PRILOSEC) 40 MG capsule TAKE 1 CAPSULE BY MOUTH 2 TIMES DAILY BEFORE A MEAL 60 capsule 5   ibuprofen (ADVIL,MOTRIN) 200 MG tablet Take 400 mg by mouth every 6 (six) hours as needed for headache or moderate pain.     losartan (COZAAR) 25 MG tablet TAKE 1 TABLET BY MOUTH EVERY DAY 90 tablet 3   sertraline (ZOLOFT) 50 MG tablet Take 1 tablet (50 mg total) by mouth daily. 90 tablet 0   hydrOXYzine (VISTARIL) 25 MG capsule Take 1 capsule (25 mg total) by mouth every 8 (eight) hours as needed. (Patient not taking: Reported on 06/22/2022) 30 capsule 0   No facility-administered medications prior to visit.    ROS Review of Systems  Constitutional:  Negative for diaphoresis and  fatigue.  HENT: Negative.    Eyes: Negative.   Respiratory: Negative.  Negative for cough, chest tightness, shortness of breath and wheezing.   Cardiovascular:  Negative for chest pain, palpitations and leg swelling.  Gastrointestinal:  Negative for abdominal pain, constipation, diarrhea, nausea and vomiting.  Endocrine: Negative.   Genitourinary: Negative.  Negative for difficulty urinating.  Musculoskeletal: Negative.   Skin:  Positive for wound. Negative for color change.  Neurological:  Negative for dizziness, weakness and light-headedness.  Hematological:  Negative for adenopathy. Does not bruise/bleed easily.  Psychiatric/Behavioral: Negative.      Objective:  BP 140/90 (BP Location: Right Arm, Patient Position: Sitting, Cuff Size: Large)   Pulse (!) 46   Temp 98.5 F (36.9 C) (Oral)   Ht 5\' 10"  (1.778 m)   Wt 190 lb (86.2 kg)   SpO2 97%   BMI 27.26 kg/m   Physical Exam Vitals reviewed.  HENT:     Nose: Nose normal.     Mouth/Throat:     Mouth: Mucous membranes are moist.  Eyes:     General: No scleral icterus.    Conjunctiva/sclera: Conjunctivae normal.  Cardiovascular:     Rate and Rhythm: Bradycardia present.     Heart sounds: No murmur heard.    No gallop.  Pulmonary:     Effort: Pulmonary effort is normal.     Breath sounds: No stridor. No wheezing, rhonchi or rales.  Abdominal:     General: Abdomen is flat.     Palpations: There is no mass.     Tenderness: There is no abdominal tenderness. There is no  guarding.     Hernia: No hernia is present.  Musculoskeletal:        General: Normal range of motion.     Cervical back: Neck supple.     Right lower leg: No edema.     Left lower leg: No edema.  Lymphadenopathy:     Cervical: No cervical adenopathy.  Skin:    General: Skin is warm and dry.     Comments: Healed small abrasions on bilateral anterior lower extremities.  There is no erythema, exudate, induration, or fluctuance.  Neurological:      General: No focal deficit present.     Mental Status: He is alert.  Psychiatric:        Mood and Affect: Mood normal.        Behavior: Behavior normal.     Lab Results  Component Value Date   WBC 5.4 11/17/2021   HGB 16.9 11/17/2021   HCT 46.9 11/17/2021   PLT 198 11/17/2021   GLUCOSE 87 11/17/2021   CHOL 264 (H) 11/17/2021   TRIG 112 11/17/2021   HDL 67 11/17/2021   LDLCALC 175 (H) 11/17/2021   ALT 35 11/17/2021   AST 26 11/17/2021   NA 136 11/17/2021   K 4.2 11/17/2021   CL 102 11/17/2021   CREATININE 1.03 11/17/2021   BUN 12 11/17/2021   CO2 26 11/17/2021   TSH 1.475 11/17/2021     Assessment & Plan:   Siler was seen today for hypertension.  Diagnoses and all orders for this visit:  Abrasion, leg w/o infection- Will update his Tdap.  Situational mixed anxiety and depressive disorder- He would like to stay on the current dose of sertraline. -     sertraline (ZOLOFT) 50 MG tablet; Take 1 tablet (50 mg total) by mouth daily.  Primary hypertension- His BP is not at goal. Will monitor his electrolytes and renal function.  Will upgrade to a more potent ARB. -     Basic metabolic panel; Future -     Aldosterone + renin activity w/ ratio; Future  Need for vaccination -     Tdap vaccine greater than or equal to 7yo IM   I have discontinued Jeannett Senior Runner's ibuprofen, hydrOXYzine, and losartan. I am also having him start on olmesartan. Additionally, I am having him maintain his omeprazole and sertraline.  Meds ordered this encounter  Medications   sertraline (ZOLOFT) 50 MG tablet    Sig: Take 1 tablet (50 mg total) by mouth daily.    Dispense:  90 tablet    Refill:  1   olmesartan (BENICAR) 20 MG tablet    Sig: Take 1 tablet (20 mg total) by mouth daily.    Dispense:  90 tablet    Refill:  1     Follow-up: Return in about 6 months (around 12/23/2022).  Sanda Linger, MD

## 2022-06-22 NOTE — Patient Instructions (Signed)
Hypertension, Adult High blood pressure (hypertension) is when the force of blood pumping through the arteries is too strong. The arteries are the blood vessels that carry blood from the heart throughout the body. Hypertension forces the heart to work harder to pump blood and may cause arteries to become narrow or stiff. Untreated or uncontrolled hypertension can lead to a heart attack, heart failure, a stroke, kidney disease, and other problems. A blood pressure reading consists of a higher number over a lower number. Ideally, your blood pressure should be below 120/80. The first ("top") number is called the systolic pressure. It is a measure of the pressure in your arteries as your heart beats. The second ("bottom") number is called the diastolic pressure. It is a measure of the pressure in your arteries as the heart relaxes. What are the causes? The exact cause of this condition is not known. There are some conditions that result in high blood pressure. What increases the risk? Certain factors may make you more likely to develop high blood pressure. Some of these risk factors are under your control, including: Smoking. Not getting enough exercise or physical activity. Being overweight. Having too much fat, sugar, calories, or salt (sodium) in your diet. Drinking too much alcohol. Other risk factors include: Having a personal history of heart disease, diabetes, high cholesterol, or kidney disease. Stress. Having a family history of high blood pressure and high cholesterol. Having obstructive sleep apnea. Age. The risk increases with age. What are the signs or symptoms? High blood pressure may not cause symptoms. Very high blood pressure (hypertensive crisis) may cause: Headache. Fast or irregular heartbeats (palpitations). Shortness of breath. Nosebleed. Nausea and vomiting. Vision changes. Severe chest pain, dizziness, and seizures. How is this diagnosed? This condition is diagnosed by  measuring your blood pressure while you are seated, with your arm resting on a flat surface, your legs uncrossed, and your feet flat on the floor. The cuff of the blood pressure monitor will be placed directly against the skin of your upper arm at the level of your heart. Blood pressure should be measured at least twice using the same arm. Certain conditions can cause a difference in blood pressure between your right and left arms. If you have a high blood pressure reading during one visit or you have normal blood pressure with other risk factors, you may be asked to: Return on a different day to have your blood pressure checked again. Monitor your blood pressure at home for 1 week or longer. If you are diagnosed with hypertension, you may have other blood or imaging tests to help your health care provider understand your overall risk for other conditions. How is this treated? This condition is treated by making healthy lifestyle changes, such as eating healthy foods, exercising more, and reducing your alcohol intake. You may be referred for counseling on a healthy diet and physical activity. Your health care provider may prescribe medicine if lifestyle changes are not enough to get your blood pressure under control and if: Your systolic blood pressure is above 130. Your diastolic blood pressure is above 80. Your personal target blood pressure may vary depending on your medical conditions, your age, and other factors. Follow these instructions at home: Eating and drinking  Eat a diet that is high in fiber and potassium, and low in sodium, added sugar, and fat. An example of this eating plan is called the DASH diet. DASH stands for Dietary Approaches to Stop Hypertension. To eat this way: Eat   plenty of fresh fruits and vegetables. Try to fill one half of your plate at each meal with fruits and vegetables. Eat whole grains, such as whole-wheat pasta, brown rice, or whole-grain bread. Fill about one  fourth of your plate with whole grains. Eat or drink low-fat dairy products, such as skim milk or low-fat yogurt. Avoid fatty cuts of meat, processed or cured meats, and poultry with skin. Fill about one fourth of your plate with lean proteins, such as fish, chicken without skin, beans, eggs, or tofu. Avoid pre-made and processed foods. These tend to be higher in sodium, added sugar, and fat. Reduce your daily sodium intake. Many people with hypertension should eat less than 1,500 mg of sodium a day. Do not drink alcohol if: Your health care provider tells you not to drink. You are pregnant, may be pregnant, or are planning to become pregnant. If you drink alcohol: Limit how much you have to: 0-1 drink a day for women. 0-2 drinks a day for men. Know how much alcohol is in your drink. In the [place] one drink equals one 12 oz bottle of beer (355 mL), one 5 oz glass of wine (148 mL), or one 1 oz glass of hard liquor (44 mL). Lifestyle  Work with your health care provider to maintain a healthy body weight or to lose weight. Ask what an ideal weight is for you. Get at least 30 minutes of exercise that causes your heart to beat faster (aerobic exercise) most days of the week. Activities may include walking, swimming, or biking. Include exercise to strengthen your muscles (resistance exercise), such as Pilates or lifting weights, as part of your weekly exercise routine. Try to do these types of exercises for 30 minutes at least 3 days a week. Do not use any products that contain nicotine or tobacco. These products include cigarettes, chewing tobacco, and vaping devices, such as e-cigarettes. If you need help quitting, ask your health care provider. Monitor your blood pressure at home as told by your health care provider. Keep all follow-up visits. This is important. Medicines Take over-the-counter and prescription medicines only as told by your health care provider. Follow directions carefully. Blood  pressure medicines must be taken as prescribed. Do not skip doses of blood pressure medicine. Doing this puts you at risk for problems and can make the medicine less effective. Ask your health care provider about side effects or reactions to medicines that you should watch for. Contact a health care provider if you: Think you are having a reaction to a medicine you are taking. Have headaches that keep coming back (recurring). Feel dizzy. Have swelling in your ankles. Have trouble with your vision. Get help right away if you: Develop a severe headache or confusion. Have unusual weakness or numbness. Feel faint. Have severe pain in your chest or abdomen. Vomit repeatedly. Have trouble breathing. These symptoms may be an emergency. Get help right away. Call 911. Do not wait to see if the symptoms will go away. Do not drive yourself to the hospital. Summary Hypertension is when the force of blood pumping through your arteries is too strong. If this condition is not controlled, it may put you at risk for serious complications. Your personal target blood pressure may vary depending on your medical conditions, your age, and other factors. For most people, a normal blood pressure is less than 120/80. Hypertension is treated with lifestyle changes, medicines, or a combination of both. Lifestyle changes include losing weight, eating a healthy,   low-sodium diet, exercising more, and limiting alcohol. This information is not intended to replace advice given to you by your health care provider. Make sure you discuss any questions you have with your health care provider. Document Revised: 10/07/2021 Document Reviewed: 10/07/2021 Elsevier Patient Education  2023 [employer]  

## 2022-06-24 ENCOUNTER — Encounter: Payer: Self-pay | Admitting: Internal Medicine

## 2022-06-24 DIAGNOSIS — I10 Essential (primary) hypertension: Secondary | ICD-10-CM | POA: Diagnosis not present

## 2022-06-24 LAB — BASIC METABOLIC PANEL
BUN: 16 mg/dL (ref 6–23)
CO2: 27 mEq/L (ref 19–32)
Calcium: 9.5 mg/dL (ref 8.4–10.5)
Chloride: 103 mEq/L (ref 96–112)
Creatinine, Ser: 1.02 mg/dL (ref 0.40–1.50)
GFR: 87.52 mL/min (ref >60.00)
Glucose, Bld: 90 mg/dL (ref 70–99)
Potassium: 4.5 mEq/L (ref 3.5–5.1)
Sodium: 136 mEq/L (ref 135–145)

## 2022-07-04 LAB — ALDOSTERONE + RENIN ACTIVITY W/ RATIO
ALDO / PRA Ratio: 4 Ratio (ref 0.9–28.9)
Aldosterone: 8 ng/dL
Renin Activity: 1.98 ng/mL/h (ref 0.25–5.82)

## 2022-08-11 ENCOUNTER — Other Ambulatory Visit: Payer: Self-pay | Admitting: Gastroenterology

## 2022-09-24 ENCOUNTER — Other Ambulatory Visit: Payer: Self-pay | Admitting: Internal Medicine

## 2022-09-24 DIAGNOSIS — I10 Essential (primary) hypertension: Secondary | ICD-10-CM

## 2022-09-24 DIAGNOSIS — F4323 Adjustment disorder with mixed anxiety and depressed mood: Secondary | ICD-10-CM

## 2023-04-12 ENCOUNTER — Other Ambulatory Visit: Payer: Self-pay | Admitting: Internal Medicine

## 2023-04-12 DIAGNOSIS — I10 Essential (primary) hypertension: Secondary | ICD-10-CM

## 2023-06-30 DIAGNOSIS — M25531 Pain in right wrist: Secondary | ICD-10-CM | POA: Diagnosis not present

## 2023-06-30 DIAGNOSIS — M25541 Pain in joints of right hand: Secondary | ICD-10-CM | POA: Diagnosis not present

## 2023-06-30 DIAGNOSIS — M25532 Pain in left wrist: Secondary | ICD-10-CM | POA: Diagnosis not present

## 2023-06-30 DIAGNOSIS — M25542 Pain in joints of left hand: Secondary | ICD-10-CM | POA: Diagnosis not present

## 2023-07-26 DIAGNOSIS — M25531 Pain in right wrist: Secondary | ICD-10-CM | POA: Diagnosis not present

## 2023-07-26 DIAGNOSIS — M9907 Segmental and somatic dysfunction of upper extremity: Secondary | ICD-10-CM | POA: Diagnosis not present

## 2023-08-13 ENCOUNTER — Encounter: Payer: Self-pay | Admitting: Internal Medicine

## 2023-08-26 ENCOUNTER — Encounter: Payer: Self-pay | Admitting: Internal Medicine

## 2023-08-26 ENCOUNTER — Ambulatory Visit (INDEPENDENT_AMBULATORY_CARE_PROVIDER_SITE_OTHER): Payer: BC Managed Care – PPO | Admitting: Internal Medicine

## 2023-08-26 VITALS — BP 150/88 | HR 56 | Temp 97.6°F | Resp 16 | Ht 70.0 in | Wt 195.0 lb

## 2023-08-26 DIAGNOSIS — Z0001 Encounter for general adult medical examination with abnormal findings: Secondary | ICD-10-CM | POA: Diagnosis not present

## 2023-08-26 DIAGNOSIS — Z0000 Encounter for general adult medical examination without abnormal findings: Secondary | ICD-10-CM | POA: Diagnosis not present

## 2023-08-26 DIAGNOSIS — R41840 Attention and concentration deficit: Secondary | ICD-10-CM

## 2023-08-26 DIAGNOSIS — I10 Essential (primary) hypertension: Secondary | ICD-10-CM

## 2023-08-26 DIAGNOSIS — E785 Hyperlipidemia, unspecified: Secondary | ICD-10-CM | POA: Diagnosis not present

## 2023-08-26 DIAGNOSIS — Z1159 Encounter for screening for other viral diseases: Secondary | ICD-10-CM | POA: Diagnosis not present

## 2023-08-26 DIAGNOSIS — Z114 Encounter for screening for human immunodeficiency virus [HIV]: Secondary | ICD-10-CM | POA: Insufficient documentation

## 2023-08-26 DIAGNOSIS — K219 Gastro-esophageal reflux disease without esophagitis: Secondary | ICD-10-CM

## 2023-08-26 LAB — URINALYSIS, ROUTINE W REFLEX MICROSCOPIC
Bilirubin Urine: NEGATIVE
Hgb urine dipstick: NEGATIVE
Ketones, ur: NEGATIVE
Leukocytes,Ua: NEGATIVE
Nitrite: NEGATIVE
RBC / HPF: NONE SEEN (ref 0–?)
Specific Gravity, Urine: <=1.005 — AB (ref 1.000–1.030)
Total Protein, Urine: NEGATIVE
Urine Glucose: NEGATIVE
Urobilinogen, UA: 0.2 (ref 0.0–1.0)
WBC, UA: NONE SEEN (ref 0–?)
pH: 6 (ref 5.0–8.0)

## 2023-08-26 LAB — LIPID PANEL
Cholesterol: 242 mg/dL — ABNORMAL HIGH (ref 0–200)
HDL: 54.3 mg/dL (ref >39.00)
LDL Cholesterol: 134 mg/dL — ABNORMAL HIGH (ref 0–99)
NonHDL: 187.43
Total CHOL/HDL Ratio: 4
Triglycerides: 266 mg/dL — ABNORMAL HIGH (ref 0.0–149.0)
VLDL: 53.2 mg/dL — ABNORMAL HIGH (ref 0.0–40.0)

## 2023-08-26 LAB — CBC WITH DIFFERENTIAL/PLATELET
Basophils Absolute: 0 10*3/uL (ref 0.0–0.1)
Basophils Relative: 0.8 % (ref 0.0–3.0)
Eosinophils Absolute: 0.1 10*3/uL (ref 0.0–0.7)
Eosinophils Relative: 1.4 % (ref 0.0–5.0)
HCT: 47.1 % (ref 39.0–52.0)
Hemoglobin: 15.9 g/dL (ref 13.0–17.0)
Lymphocytes Relative: 23.2 % (ref 12.0–46.0)
Lymphs Abs: 1.3 10*3/uL (ref 0.7–4.0)
MCHC: 33.9 g/dL (ref 30.0–36.0)
MCV: 92.3 fl (ref 78.0–100.0)
Monocytes Absolute: 0.5 10*3/uL (ref 0.1–1.0)
Monocytes Relative: 9.1 % (ref 3.0–12.0)
Neutro Abs: 3.8 10*3/uL (ref 1.4–7.7)
Neutrophils Relative %: 65.5 % (ref 43.0–77.0)
Platelets: 211 10*3/uL (ref 150.0–400.0)
RBC: 5.1 Mil/uL (ref 4.22–5.81)
RDW: 13.1 % (ref 11.5–15.5)
WBC: 5.7 10*3/uL (ref 4.0–10.5)

## 2023-08-26 LAB — PSA: PSA: 1.06 ng/mL (ref 0.10–4.00)

## 2023-08-26 LAB — BASIC METABOLIC PANEL
BUN: 17 mg/dL (ref 6–23)
CO2: 24 meq/L (ref 19–32)
Calcium: 9.4 mg/dL (ref 8.4–10.5)
Chloride: 103 meq/L (ref 96–112)
Creatinine, Ser: 0.89 mg/dL (ref 0.40–1.50)
GFR: 101.22 mL/min (ref >60.00)
Glucose, Bld: 112 mg/dL — ABNORMAL HIGH (ref 70–99)
Potassium: 3.7 meq/L (ref 3.5–5.1)
Sodium: 137 meq/L (ref 135–145)

## 2023-08-26 LAB — TSH: TSH: 1.62 u[IU]/mL (ref 0.35–5.50)

## 2023-08-26 LAB — HEPATIC FUNCTION PANEL
ALT: 28 U/L (ref 0–53)
AST: 24 U/L (ref 0–37)
Albumin: 4.4 g/dL (ref 3.5–5.2)
Alkaline Phosphatase: 54 U/L (ref 39–117)
Bilirubin, Direct: 0.1 mg/dL (ref 0.0–0.3)
Total Bilirubin: 0.5 mg/dL (ref 0.2–1.2)
Total Protein: 6.9 g/dL (ref 6.0–8.3)

## 2023-08-26 NOTE — Progress Notes (Signed)
Subjective:  Patient ID: Brandon [name], male    DOB: January 17, 1976  Age: 47 y.o. MRN: [id number]  CC: Annual Exam, Hyperlipidemia, and Hypertension   HPI Brandon [name] presents for a CPX and f/up ---  Discussed the use of AI scribe software for clinical note transcription with the patient, who gave verbal consent to proceed.  History of Present Illness   The patient, a middle-aged individual with a history of indigestion, migraines, and elevated cholesterol, presents with concerns about weight gain and potential ADHD. They report a recent weight increase, which they attribute to a job change and decreased physical activity. They express a desire to return to their previous weight, which they maintain through cycling. They have a history of indigestion, which has improved after dietary modifications, specifically eliminating hot sauce.  The patient also reports ongoing mental health concerns, which they manage through regular counseling sessions. These sessions primarily focus on family dynamics, particularly issues with their mother. 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  The patient has a history of migraines, which they describe as infrequent. They also report a dull headache if they miss a dose of their blood pressure medication, Olmesartan. They have discontinued Sertraline, which they were previously taking for depression, and now only take Omeprazole as needed for indigestion.  The patient has a family history of cardiovascular disease, with their father having undergone an ablation. They underwent a colonoscopy a year ago with no significant findings. They report nocturia, particularly after consuming liquids after 9 PM or alcohol, which requires them to wake up once or twice during the night to urinate.  The patient is also concerned about potential ADHD, as  they have been finding it difficult to concentrate on their new job, which requires more creativity and inventiveness than their previous role. Their family members have suggested that they may have ADHD, and they are considering seeking a formal evaluation.       Outpatient Medications Prior to Visit  Medication Sig Dispense Refill   omeprazole (PRILOSEC) 40 MG capsule TAKE 1 CAPSULE BY MOUTH 2 TIMES DAILY BEFORE A MEAL 60 capsule 5   olmesartan (BENICAR) 20 MG tablet TAKE 1 TABLET BY MOUTH EVERY DAY 90 tablet 1   sertraline (ZOLOFT) 50 MG tablet TAKE 1 TABLET BY MOUTH EVERY DAY 90 tablet 1   No facility-administered medications prior to visit.    ROS Review of Systems  Constitutional:  Positive for unexpected weight change (wt gain). Negative for chills, diaphoresis, fatigue and fever.  HENT: Negative.  Negative for trouble swallowing and voice change.   Eyes: Negative.  Negative for visual disturbance.  Respiratory:  Negative for cough, chest tightness, shortness of breath and wheezing.   Cardiovascular:  Negative for chest pain, palpitations and leg swelling.  Gastrointestinal:  Negative for abdominal pain, constipation, diarrhea, nausea and vomiting.  Genitourinary: Negative.  Negative for difficulty urinating and scrotal swelling.  Musculoskeletal: Negative.  Negative for arthralgias.  Skin: Negative.  Negative for color change.  Neurological: Negative.  Negative for dizziness, syncope, weakness and light-headedness.  Hematological:  Negative for adenopathy. Does not bruise/bleed easily.  Psychiatric/Behavioral:  Positive for decreased concentration. Negative for agitation, confusion, dysphoric mood and suicidal ideas. The patient is not nervous/anxious.     Objective:  BP (!) 150/88 (BP Location: Left Arm, Patient Position: Sitting, Cuff Size: Large)   Pulse (!) 56   Temp  97.6 F (36.4 C) (Oral)   Resp 16   Ht 5\' 10"  (1.778 m)   Wt 195 lb (88.5 kg)   SpO2 96%   BMI 27.98  kg/m   BP Readings from Last 3 Encounters:  08/26/23 (!) 150/88  06/22/22 140/90  11/17/21 140/82    Wt Readings from Last 3 Encounters:  08/26/23 195 lb (88.5 kg)  06/22/22 190 lb (86.2 kg)  11/17/21 186 lb 6.4 oz (84.6 kg)    Physical Exam Vitals reviewed.  Constitutional:      Appearance: Normal appearance.  HENT:     Nose: Nose normal.     Mouth/Throat:     Mouth: Mucous membranes are moist.  Eyes:     General: No scleral icterus.    Conjunctiva/sclera: Conjunctivae normal.  Cardiovascular:     Rate and Rhythm: Regular rhythm. Bradycardia present.     Heart sounds: Normal heart sounds, S1 normal and S2 normal.     No friction rub. No gallop.     Comments: EKG- SB, 55 bpm LAD Anteroseptal infarct pattern is old Pulmonary:     Effort: Pulmonary effort is normal.     Breath sounds: No stridor. No wheezing, rhonchi or rales.  Abdominal:     General: Abdomen is flat.     Palpations: There is no mass.     Tenderness: There is no abdominal tenderness. There is no guarding.     Hernia: No hernia is present.  Musculoskeletal:        General: Normal range of motion.     Cervical back: Neck supple.     Right lower leg: No edema.     Left lower leg: No edema.  Lymphadenopathy:     Cervical: No cervical adenopathy.  Skin:    General: Skin is warm and dry.     Findings: No rash.  Neurological:     General: No focal deficit present.     Mental Status: He is alert. Mental status is at baseline.  Psychiatric:        Mood and Affect: Mood normal.        Behavior: Behavior normal.     Lab Results  Component Value Date   WBC 5.7 08/26/2023   HGB 15.9 08/26/2023   HCT 47.1 08/26/2023   PLT 211.0 08/26/2023   GLUCOSE 112 (H) 08/26/2023   CHOL 242 (H) 08/26/2023   TRIG 266.0 (H) 08/26/2023   HDL 54.30 08/26/2023   LDLCALC 134 (H) 08/26/2023   ALT 28 08/26/2023   AST 24 08/26/2023   NA 137 08/26/2023   K 3.7 08/26/2023   CL 103 08/26/2023   CREATININE 0.89  08/26/2023   BUN 17 08/26/2023   CO2 24 08/26/2023   TSH 1.62 08/26/2023   PSA 1.06 08/26/2023    CT CARDIAC SCORING (SELF PAY ONLY)  Addendum Date: 01/05/2022   ADDENDUM REPORT: 01/05/2022 19:39 CLINICAL DATA:  Cardiovascular Disease Risk stratification EXAM: Coronary Calcium Score TECHNIQUE: A gated, non-contrast computed tomography scan of the heart was performed using 3mm slice thickness. Axial images were analyzed on a dedicated workstation. Calcium scoring of the coronary arteries was performed using the Agatston method. FINDINGS: Coronary Calcium Score: Left main: 0 Left anterior descending artery: 0 Left circumflex artery: 0 Right coronary artery: 0 Total: 0 Percentile: NA Pericardium: Normal. Non-cardiac: See separate report from [facility]. IMPRESSION: Coronary calcium score of 0. RECOMMENDATIONS: Coronary artery calcium (CAC) score is a strong predictor of incident coronary heart disease (CHD)  and provides predictive information beyond traditional risk factors. CAC scoring is reasonable to use in the decision to withhold, postpone, or initiate statin therapy in intermediate-risk or selected borderline-risk asymptomatic adults (age 58-75 years and LDL-C >=70 to <190 mg/dL) who do not have diabetes or established atherosclerotic cardiovascular disease (ASCVD).* In intermediate-risk (10-year ASCVD risk >=7.5% to <20%) adults or selected borderline-risk (10-year ASCVD risk >=5% to <7.5%) adults in whom a CAC score is measured for the purpose of making a treatment decision the following recommendations have been made: If CAC=0, it is reasonable to withhold statin therapy and reassess in 5 to 10 years, as long as higher risk conditions are absent (diabetes mellitus, family history of premature CHD in first degree relatives (males <55 years; females <65 years), cigarette smoking, or LDL >=190 mg/dL). If CAC is 1 to 99, it is reasonable to initiate statin therapy for patients >=58 years of age.  If CAC is >=100 or >=75th percentile, it is reasonable to initiate statin therapy at any age. Cardiology referral should be considered for patients with CAC scores >=400 or >=75th percentile. *2018 AHA/ACC/AACVPR/AAPA/ABC/ACPM/ADA/AGS/APhA/ASPC/NLA/PCNA Guideline on the Management of Blood Cholesterol: A Report of the American College of Cardiology/American Heart Association Task Force on Clinical Practice Guidelines. J Am Coll Cardiol. 2019;73(24):3168-3209. Lennie Odor, MD Electronically Signed   By: Lennie Odor M.D.   On: 01/05/2022 19:39   Result Date: 01/05/2022 EXAM: OVER-READ INTERPRETATION  CT CHEST The following report is an over-read performed by radiologist Dr. Jeronimo Greaves of [facility] on 01/05/2022. This over-read does not include interpretation of cardiac or coronary anatomy or pathology. The calcium score interpretation by the cardiologist is attached. COMPARISON:  Chest CT 09/20/2010. FINDINGS: Vascular: Minimal transverse aortic atherosclerosis on 03/03. Mediastinum/Nodes: No imaged thoracic adenopathy. Lungs/Pleura: No pleural fluid. 3 mm subpleural lymph node along the left major fissure on 54/4 is similar to on 43/3 of the prior exam. Upper Abdomen: Normal imaged portions of the spleen, stomach. Possible 4 mm hepatic dome cyst on 67/3. Musculoskeletal: No acute osseous abnormality. Mild right hemidiaphragm elevation. IMPRESSION: No acute findings in the imaged extracardiac chest. Aortic Atherosclerosis (ICD10-I70.0). Electronically Signed: By: Jeronimo Greaves M.D. On: 01/05/2022 15:26    Assessment & Plan:  Primary hypertension- His blood pressure is not at goal.  Will evaluate for hyperaldosteronism.  Will restart the ARB. -     Aldosterone + renin activity w/ ratio; Future -     TSH; Future -     Urinalysis, Routine w reflex microscopic; Future -     Hepatic function panel; Future -     Basic metabolic panel; Future -     CBC with Differential/Platelet; Future -      EKG 12-Lead -     Olmesartan Medoxomil; Take 1 tablet (40 mg total) by mouth daily.  Dispense: 90 tablet; Refill: 1  Dyslipidemia, goal LDL below 130- Statin is not indicated. -     Lipid panel; Future -     Lipoprotein A (LPA); Future -     TSH; Future -     Hepatic function panel; Future  Encounter for screening for HIV -     HIV Antibody (routine testing w rflx); Future  Need for hepatitis C screening test -     Hepatitis C antibody; Future  Encounter for general adult medical examination with abnormal findings - Exam completed, labs reviewed, vaccines reviewed, cancer screenings addressed, pt ed material was given.  -     PSA;  Future -     Hepatitis C antibody; Future -     HIV Antibody (routine testing w rflx); Future  Gastroesophageal reflux disease without esophagitis -     CBC with Differential/Platelet; Future  Attention and concentration deficit -     Ambulatory referral to Psychology     Follow-up: Return in about 6 months (around 02/23/2024).  Sanda Linger, MD

## 2023-08-26 NOTE — Patient Instructions (Signed)
Health Maintenance, Male Adopting a healthy lifestyle and getting preventive care are important in promoting health and wellness. Ask your health care provider about: The right schedule for you to have regular tests and exams. Things you can do on your own to prevent diseases and keep yourself healthy. What should I know about diet, weight, and exercise? Eat a healthy diet  Eat a diet that includes plenty of vegetables, fruits, low-fat dairy products, and lean protein. Do not eat a lot of foods that are high in solid fats, added sugars, or sodium. Maintain a healthy weight Body mass index (BMI) is a measurement that can be used to identify possible weight problems. It estimates body fat based on height and weight. Your health care provider can help determine your BMI and help you achieve or maintain a healthy weight. Get regular exercise Get regular exercise. This is one of the most important things you can do for your health. Most adults should: Exercise for at least 150 minutes each week. The exercise should increase your heart rate and make you sweat (moderate-intensity exercise). Do strengthening exercises at least twice a week. This is in addition to the moderate-intensity exercise. Spend less time sitting. Even light physical activity can be beneficial. Watch cholesterol and blood lipids Have your blood tested for lipids and cholesterol at 48 years of age, then have this test every 5 years. You may need to have your cholesterol levels checked more often if: Your lipid or cholesterol levels are high. You are older than 48 years of age. You are at high risk for heart disease. What should I know about cancer screening? Many types of cancers can be detected early and may often be prevented. Depending on your health history and family history, you may need to have cancer screening at various ages. This may include screening for: Colorectal cancer. Prostate cancer. Skin cancer. Lung  cancer. What should I know about heart disease, diabetes, and high blood pressure? Blood pressure and heart disease High blood pressure causes heart disease and increases the risk of stroke. This is more likely to develop in people who have high blood pressure readings or are overweight. Talk with your health care provider about your target blood pressure readings. Have your blood pressure checked: Every 3-5 years if you are 18-39 years of age. Every year if you are 40 years old or older. If you are between the ages of 65 and 75 and are a current or former smoker, ask your health care provider if you should have a one-time screening for abdominal aortic aneurysm (AAA). Diabetes Have regular diabetes screenings. This checks your fasting blood sugar level. Have the screening done: Once every three years after age 45 if you are at a normal weight and have a low risk for diabetes. More often and at a younger age if you are overweight or have a high risk for diabetes. What should I know about preventing infection? Hepatitis B If you have a higher risk for hepatitis B, you should be screened for this virus. Talk with your health care provider to find out if you are at risk for hepatitis B infection. Hepatitis C Blood testing is recommended for: Everyone born from 1945 through 1965. Anyone with known risk factors for hepatitis C. Sexually transmitted infections (STIs) You should be screened each year for STIs, including gonorrhea and chlamydia, if: You are sexually active and are younger than 48 years of age. You are older than 48 years of age and your   health care provider tells you that you are at risk for this type of infection. Your sexual activity has changed since you were last screened, and you are at increased risk for chlamydia or gonorrhea. Ask your health care provider if you are at risk. Ask your health care provider about whether you are at high risk for HIV. Your health care provider  may recommend a prescription medicine to help prevent HIV infection. If you choose to take medicine to prevent HIV, you should first get tested for HIV. You should then be tested every 3 months for as long as you are taking the medicine. Follow these instructions at home: Alcohol use Do not drink alcohol if your health care provider tells you not to drink. If you drink alcohol: Limit how much you have to 0-2 drinks a day. Know how much alcohol is in your drink. In the [place] one drink equals one 12 oz bottle of beer (355 mL), one 5 oz glass of wine (148 mL), or one 1 oz glass of hard liquor (44 mL). Lifestyle Do not use any products that contain nicotine or tobacco. These products include cigarettes, chewing tobacco, and vaping devices, such as e-cigarettes. If you need help quitting, ask your health care provider. Do not use street drugs. Do not share needles. Ask your health care provider for help if you need support or information about quitting drugs. General instructions Schedule regular health, dental, and eye exams. Stay current with your vaccines. Tell your health care provider if: You often feel depressed. You have ever been abused or do not feel safe at home. Summary Adopting a healthy lifestyle and getting preventive care are important in promoting health and wellness. Follow your health care provider's instructions about healthy diet, exercising, and getting tested or screened for diseases. Follow your health care provider's instructions on monitoring your cholesterol and blood pressure. This information is not intended to replace advice given to you by your health care provider. Make sure you discuss any questions you have with your health care provider. Document Revised: 04/21/2021 Document Reviewed: 04/21/2021 Elsevier Patient Education  2024 [employer]  

## 2023-08-27 MED ORDER — OLMESARTAN MEDOXOMIL 40 MG PO TABS: 40.0000 mg | ORAL_TABLET | Freq: Every day | ORAL | 1 refills | Status: DC

## 2023-08-30 LAB — HIV ANTIBODY (ROUTINE TESTING W REFLEX): HIV 1&2 Ab, 4th Generation: NONREACTIVE

## 2023-08-30 LAB — ALDOSTERONE + RENIN ACTIVITY W/ RATIO
ALDO / PRA Ratio: 0.1 ratio — ABNORMAL LOW (ref 0.9–28.9)
Aldosterone: 2 ng/dL
Renin Activity: 18.39 ng/mL/h — ABNORMAL HIGH (ref 0.25–5.82)

## 2023-08-30 LAB — HEPATITIS C ANTIBODY: Hepatitis C Ab: NONREACTIVE

## 2023-08-30 LAB — LIPOPROTEIN A (LPA): Lipoprotein (a): 39 nmol/L (ref <75)

## 2023-10-04 DIAGNOSIS — F4322 Adjustment disorder with anxiety: Secondary | ICD-10-CM | POA: Diagnosis not present

## 2023-12-21 ENCOUNTER — Other Ambulatory Visit: Payer: Self-pay | Admitting: Internal Medicine

## 2023-12-21 DIAGNOSIS — I10 Essential (primary) hypertension: Secondary | ICD-10-CM

## 2024-01-12 ENCOUNTER — Other Ambulatory Visit: Payer: Self-pay | Admitting: Medical Genetics

## 2024-02-23 ENCOUNTER — Encounter: Payer: Self-pay | Admitting: Internal Medicine

## 2024-02-23 ENCOUNTER — Ambulatory Visit: Payer: BC Managed Care – PPO | Admitting: Internal Medicine

## 2024-02-23 VITALS — BP 136/86 | HR 52 | Temp 97.7°F | Ht 70.0 in | Wt 192.6 lb

## 2024-02-23 DIAGNOSIS — R001 Bradycardia, unspecified: Secondary | ICD-10-CM

## 2024-02-23 DIAGNOSIS — K219 Gastro-esophageal reflux disease without esophagitis: Secondary | ICD-10-CM | POA: Diagnosis not present

## 2024-02-23 DIAGNOSIS — I10 Essential (primary) hypertension: Secondary | ICD-10-CM | POA: Diagnosis not present

## 2024-02-23 LAB — CBC WITH DIFFERENTIAL/PLATELET
Basophils Absolute: 0 10*3/uL (ref 0.0–0.1)
Basophils Relative: 0.6 % (ref 0.0–3.0)
Eosinophils Absolute: 0.1 10*3/uL (ref 0.0–0.7)
Eosinophils Relative: 1.9 % (ref 0.0–5.0)
HCT: 47.5 % (ref 39.0–52.0)
Hemoglobin: 16.5 g/dL (ref 13.0–17.0)
Lymphocytes Relative: 25.7 % (ref 12.0–46.0)
Lymphs Abs: 1.3 10*3/uL (ref 0.7–4.0)
MCHC: 34.8 g/dL (ref 30.0–36.0)
MCV: 92.5 fl (ref 78.0–100.0)
Monocytes Absolute: 0.8 10*3/uL (ref 0.1–1.0)
Monocytes Relative: 14.6 % — ABNORMAL HIGH (ref 3.0–12.0)
Neutro Abs: 2.9 10*3/uL (ref 1.4–7.7)
Neutrophils Relative %: 57.2 % (ref 43.0–77.0)
Platelets: 201 10*3/uL (ref 150.0–400.0)
RBC: 5.14 Mil/uL (ref 4.22–5.81)
RDW: 12.5 % (ref 11.5–15.5)
WBC: 5.2 10*3/uL (ref 4.0–10.5)

## 2024-02-23 LAB — BASIC METABOLIC PANEL
BUN: 18 mg/dL (ref 6–23)
CO2: 30 meq/L (ref 19–32)
Calcium: 9.8 mg/dL (ref 8.4–10.5)
Chloride: 100 meq/L (ref 96–112)
Creatinine, Ser: 1.09 mg/dL (ref 0.40–1.50)
GFR: 79.88 mL/min (ref >60.00)
Glucose, Bld: 96 mg/dL (ref 70–99)
Potassium: 4.4 meq/L (ref 3.5–5.1)
Sodium: 136 meq/L (ref 135–145)

## 2024-02-23 MED ORDER — OLMESARTAN MEDOXOMIL 40 MG PO TABS: 40.0000 mg | ORAL_TABLET | Freq: Every day | ORAL | 1 refills | Status: DC

## 2024-02-23 NOTE — Progress Notes (Unsigned)
 Subjective:  Patient ID: Brandon [name], male    DOB: [birth year]  Age: 48 y.o. MRN: [id number]  CC: Hypertension   HPI Khalid Lacko presents for f/up  ----  Discussed the use of AI scribe software for clinical note transcription with the patient, who gave verbal consent to proceed.  History of Present Illness   The patient presents for follow-up of hypertension management.  He is managing hypertension with an increased dose of Benicar to 40 mg, resulting in more controlled blood pressure readings. No dizziness, lightheadedness, or near syncope during physical activities such as biking or hiking. Occasionally, he feels lightheaded when standing up too quickly, but this is infrequent.  He experiences occasional symptoms of gastroesophageal reflux disease (GERD) and manages them with Prilosec as needed. He tries to control symptoms by avoiding certain foods and denies any alarming symptoms such as painful or difficult swallowing.  He experiences normal biometric pressure headaches but does not take medication for them, preferring to take a nap if possible. He recently obtained a new glasses prescription due to a slight change in vision in one eye.  He maintains an active lifestyle, having ridden approximately 1200 miles on his bike this year, and participates in walking and hiking with his family. He drinks alcohol infrequently, about five drinks a month, and is a non-smoker.       Outpatient Medications Prior to Visit  Medication Sig Dispense Refill   omeprazole (PRILOSEC) 40 MG capsule TAKE 1 CAPSULE BY MOUTH 2 TIMES DAILY BEFORE A MEAL 60 capsule 5   olmesartan (BENICAR) 40 MG tablet TAKE 1 TABLET BY MOUTH EVERY DAY 90 tablet 0   No facility-administered medications prior to visit.    ROS Review of Systems  Constitutional: Negative.  Negative for diaphoresis and fatigue.  HENT: Negative.  Negative for trouble swallowing.   Eyes: Negative.   Respiratory:  Negative for cough,  chest tightness, shortness of breath and wheezing.   Cardiovascular:  Negative for chest pain, palpitations and leg swelling.  Gastrointestinal:  Negative for abdominal pain, constipation, diarrhea, nausea and vomiting.  Endocrine: Negative.   Genitourinary: Negative.  Negative for difficulty urinating.  Musculoskeletal: Negative.  Negative for arthralgias.  Skin: Negative.   Neurological: Negative.  Negative for dizziness and weakness.  Hematological:  Negative for adenopathy. Does not bruise/bleed easily.  Psychiatric/Behavioral: Negative.      Objective:  BP 136/86 (BP Location: Left Arm, Patient Position: Sitting, Cuff Size: Normal)   Pulse (!) 52   Temp 97.7 F (36.5 C) (Oral)   Ht 5\' 10"  (1.778 m)   Wt 192 lb 9.6 oz (87.4 kg)   SpO2 97%   BMI 27.64 kg/m   BP Readings from Last 3 Encounters:  02/23/24 136/86  08/26/23 (!) 150/88  06/22/22 140/90    Wt Readings from Last 3 Encounters:  02/23/24 192 lb 9.6 oz (87.4 kg)  08/26/23 195 lb (88.5 kg)  06/22/22 190 lb (86.2 kg)    Physical Exam Vitals reviewed.  Constitutional:      Appearance: Normal appearance.  HENT:     Mouth/Throat:     Mouth: Mucous membranes are moist.  Eyes:     General: No scleral icterus.    Conjunctiva/sclera: Conjunctivae normal.  Cardiovascular:     Rate and Rhythm: Bradycardia present.     Heart sounds: No murmur heard.    No friction rub. No gallop.  Pulmonary:     Effort: Pulmonary effort is normal.     Breath  sounds: No stridor. No wheezing, rhonchi or rales.  Abdominal:     General: Abdomen is flat.     Palpations: There is no mass.     Tenderness: There is no abdominal tenderness. There is no guarding.     Hernia: No hernia is present.  Musculoskeletal:        General: Normal range of motion.     Cervical back: Neck supple.     Right lower leg: No edema.     Left lower leg: No edema.  Lymphadenopathy:     Cervical: No cervical adenopathy.  Skin:    General: Skin is warm  and dry.  Neurological:     General: No focal deficit present.     Mental Status: He is alert.  Psychiatric:        Mood and Affect: Mood normal.     Lab Results  Component Value Date   WBC 5.2 02/23/2024   HGB 16.5 02/23/2024   HCT 47.5 02/23/2024   PLT 201.0 02/23/2024   GLUCOSE 96 02/23/2024   CHOL 242 (H) 08/26/2023   TRIG 266.0 (H) 08/26/2023   HDL 54.30 08/26/2023   LDLCALC 134 (H) 08/26/2023   ALT 28 08/26/2023   AST 24 08/26/2023   NA 136 02/23/2024   K 4.4 02/23/2024   CL 100 02/23/2024   CREATININE 1.09 02/23/2024   BUN 18 02/23/2024   CO2 30 02/23/2024   TSH 1.62 08/26/2023   PSA 1.06 08/26/2023    CT CARDIAC SCORING (SELF PAY ONLY) Addendum Date: 01/05/2022 ADDENDUM REPORT: 01/05/2022 19:39 CLINICAL DATA:  Cardiovascular Disease Risk stratification EXAM: Coronary Calcium Score TECHNIQUE: A gated, non-contrast computed tomography scan of the heart was performed using 3mm slice thickness. Axial images were analyzed on a dedicated workstation. Calcium scoring of the coronary arteries was performed using the Agatston method. FINDINGS: Coronary Calcium Score: Left main: 0 Left anterior descending artery: 0 Left circumflex artery: 0 Right coronary artery: 0 Total: 0 Percentile: NA Pericardium: Normal. Non-cardiac: See separate report from [facility]. IMPRESSION: Coronary calcium score of 0. RECOMMENDATIONS: Coronary artery calcium (CAC) score is a strong predictor of incident coronary heart disease (CHD) and provides predictive information beyond traditional risk factors. CAC scoring is reasonable to use in the decision to withhold, postpone, or initiate statin therapy in intermediate-risk or selected borderline-risk asymptomatic adults (age 70-75 years and LDL-C >=70 to <190 mg/dL) who do not have diabetes or established atherosclerotic cardiovascular disease (ASCVD).* In intermediate-risk (10-year ASCVD risk >=7.5% to <20%) adults or selected borderline-risk  (10-year ASCVD risk >=5% to <7.5%) adults in whom a CAC score is measured for the purpose of making a treatment decision the following recommendations have been made: If CAC=0, it is reasonable to withhold statin therapy and reassess in 5 to 10 years, as long as higher risk conditions are absent (diabetes mellitus, family history of premature CHD in first degree relatives (males <55 years; females <65 years), cigarette smoking, or LDL >=190 mg/dL). If CAC is 1 to 99, it is reasonable to initiate statin therapy for patients >=30 years of age. If CAC is >=100 or >=75th percentile, it is reasonable to initiate statin therapy at any age. Cardiology referral should be considered for patients with CAC scores >=400 or >=75th percentile. *2018 AHA/ACC/AACVPR/AAPA/ABC/ACPM/ADA/AGS/APhA/ASPC/NLA/PCNA Guideline on the Management of Blood Cholesterol: A Report of the American College of Cardiology/American Heart Association Task Force on Clinical Practice Guidelines. J Am Coll Cardiol. 2019;73(24):3168-3209. Lennie Odor, MD Electronically Signed   By: Gerri Spore  O'Neal M.D.   On: 01/05/2022 19:39   Result Date: 01/05/2022 EXAM: OVER-READ INTERPRETATION  CT CHEST The following report is an over-read performed by radiologist Dr. Jeronimo Greaves of [facility] on 01/05/2022. This over-read does not include interpretation of cardiac or coronary anatomy or pathology. The calcium score interpretation by the cardiologist is attached. COMPARISON:  Chest CT 09/20/2010. FINDINGS: Vascular: Minimal transverse aortic atherosclerosis on 03/03. Mediastinum/Nodes: No imaged thoracic adenopathy. Lungs/Pleura: No pleural fluid. 3 mm subpleural lymph node along the left major fissure on 54/4 is similar to on 43/3 of the prior exam. Upper Abdomen: Normal imaged portions of the spleen, stomach. Possible 4 mm hepatic dome cyst on 67/3. Musculoskeletal: No acute osseous abnormality. Mild right hemidiaphragm elevation. IMPRESSION: No acute  findings in the imaged extracardiac chest. Aortic Atherosclerosis (ICD10-I70.0). Electronically Signed: By: Jeronimo Greaves M.D. On: 01/05/2022 15:26    Assessment & Plan:  Primary hypertension- His BP is well controlled. -     Basic metabolic panel; Future -     CBC with Differential/Platelet; Future -     Olmesartan Medoxomil; Take 1 tablet (40 mg total) by mouth daily.  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  Gastroesophageal reflux disease without esophagitis- Sx's are well controlled.     Follow-up: Return in about 6 months (around 08/25/2024).  Sanda Linger, MD

## 2024-02-23 NOTE — Patient Instructions (Signed)
 Hypertension, Adult High blood pressure (hypertension) is when the force of blood pumping through the arteries is too strong. The arteries are the blood vessels that carry blood from the heart throughout the body. Hypertension forces the heart to work harder to pump blood and may cause arteries to become narrow or stiff. Untreated or uncontrolled hypertension can lead to a heart attack, heart failure, a stroke, kidney disease, and other problems. A blood pressure reading consists of a higher number over a lower number. Ideally, your blood pressure should be below 120/80. The first ("top") number is called the systolic pressure. It is a measure of the pressure in your arteries as your heart beats. The second ("bottom") number is called the diastolic pressure. It is a measure of the pressure in your arteries as the heart relaxes. What are the causes? The exact cause of this condition is not known. There are some conditions that result in high blood pressure. What increases the risk? Certain factors may make you more likely to develop high blood pressure. Some of these risk factors are under your control, including: Smoking. Not getting enough exercise or physical activity. Being overweight. Having too much fat, sugar, calories, or salt (sodium) in your diet. Drinking too much alcohol. Other risk factors include: Having a personal history of heart disease, diabetes, high cholesterol, or kidney disease. Stress. Having a family history of high blood pressure and high cholesterol. Having obstructive sleep apnea. Age. The risk increases with age. What are the signs or symptoms? High blood pressure may not cause symptoms. Very high blood pressure (hypertensive crisis) may cause: Headache. Fast or irregular heartbeats (palpitations). Shortness of breath. Nosebleed. Nausea and vomiting. Vision changes. Severe chest pain, dizziness, and seizures. How is this diagnosed? This condition is diagnosed by  measuring your blood pressure while you are seated, with your arm resting on a flat surface, your legs uncrossed, and your feet flat on the floor. The cuff of the blood pressure monitor will be placed directly against the skin of your upper arm at the level of your heart. Blood pressure should be measured at least twice using the same arm. Certain conditions can cause a difference in blood pressure between your right and left arms. If you have a high blood pressure reading during one visit or you have normal blood pressure with other risk factors, you may be asked to: Return on a different day to have your blood pressure checked again. Monitor your blood pressure at home for 1 week or longer. If you are diagnosed with hypertension, you may have other blood or imaging tests to help your health care provider understand your overall risk for other conditions. How is this treated? This condition is treated by making healthy lifestyle changes, such as eating healthy foods, exercising more, and reducing your alcohol intake. You may be referred for counseling on a healthy diet and physical activity. Your health care provider may prescribe medicine if lifestyle changes are not enough to get your blood pressure under control and if: Your systolic blood pressure is above 130. Your diastolic blood pressure is above 80. Your personal target blood pressure may vary depending on your medical conditions, your age, and other factors. Follow these instructions at home: Eating and drinking  Eat a diet that is high in fiber and potassium, and low in sodium, added sugar, and fat. An example of this eating plan is called the DASH diet. DASH stands for Dietary Approaches to Stop Hypertension. To eat this way: Eat  plenty of fresh fruits and vegetables. Try to fill one half of your plate at each meal with fruits and vegetables. Eat whole grains, such as whole-wheat pasta, brown rice, or whole-grain bread. Fill about one  fourth of your plate with whole grains. Eat or drink low-fat dairy products, such as skim milk or low-fat yogurt. Avoid fatty cuts of meat, processed or cured meats, and poultry with skin. Fill about one fourth of your plate with lean proteins, such as fish, chicken without skin, beans, eggs, or tofu. Avoid pre-made and processed foods. These tend to be higher in sodium, added sugar, and fat. Reduce your daily sodium intake. Many people with hypertension should eat less than 1,500 mg of sodium a day. Do not drink alcohol if: Your health care provider tells you not to drink. You are pregnant, may be pregnant, or are planning to become pregnant. If you drink alcohol: Limit how much you have to: 0-1 drink a day for women. 0-2 drinks a day for men. Know how much alcohol is in your drink. In the [place] one drink equals one 12 oz bottle of beer (355 mL), one 5 oz glass of wine (148 mL), or one 1 oz glass of hard liquor (44 mL). Lifestyle  Work with your health care provider to maintain a healthy body weight or to lose weight. Ask what an ideal weight is for you. Get at least 30 minutes of exercise that causes your heart to beat faster (aerobic exercise) most days of the week. Activities may include walking, swimming, or biking. Include exercise to strengthen your muscles (resistance exercise), such as Pilates or lifting weights, as part of your weekly exercise routine. Try to do these types of exercises for 30 minutes at least 3 days a week. Do not use any products that contain nicotine or tobacco. These products include cigarettes, chewing tobacco, and vaping devices, such as e-cigarettes. If you need help quitting, ask your health care provider. Monitor your blood pressure at home as told by your health care provider. Keep all follow-up visits. This is important. Medicines Take over-the-counter and prescription medicines only as told by your health care provider. Follow directions carefully. Blood  pressure medicines must be taken as prescribed. Do not skip doses of blood pressure medicine. Doing this puts you at risk for problems and can make the medicine less effective. Ask your health care provider about side effects or reactions to medicines that you should watch for. Contact a health care provider if you: Think you are having a reaction to a medicine you are taking. Have headaches that keep coming back (recurring). Feel dizzy. Have swelling in your ankles. Have trouble with your vision. Get help right away if you: Develop a severe headache or confusion. Have unusual weakness or numbness. Feel faint. Have severe pain in your chest or abdomen. Vomit repeatedly. Have trouble breathing. These symptoms may be an emergency. Get help right away. Call 911. Do not wait to see if the symptoms will go away. Do not drive yourself to the hospital. Summary Hypertension is when the force of blood pumping through your arteries is too strong. If this condition is not controlled, it may put you at risk for serious complications. Your personal target blood pressure may vary depending on your medical conditions, your age, and other factors. For most people, a normal blood pressure is less than 120/80. Hypertension is treated with lifestyle changes, medicines, or a combination of both. Lifestyle changes include losing weight, eating a healthy,  low-sodium diet, exercising more, and limiting alcohol. This information is not intended to replace advice given to you by your health care provider. Make sure you discuss any questions you have with your health care provider. Document Revised: 10/07/2021 Document Reviewed: 10/07/2021 Elsevier Patient Education  2024 [employer].

## 2024-02-24 ENCOUNTER — Other Ambulatory Visit (HOSPITAL_COMMUNITY)
Admission: RE | Admit: 2024-02-24 | Discharge: 2024-02-24 | Disposition: A | Discharge status: Home / Self Care | Payer: Self-pay | Source: Ambulatory Visit | Attending: Medical Genetics | Admitting: Medical Genetics

## 2024-02-24 ENCOUNTER — Encounter: Payer: Self-pay | Admitting: Internal Medicine

## 2024-02-25 DIAGNOSIS — R001 Bradycardia, unspecified: Secondary | ICD-10-CM | POA: Insufficient documentation

## 2024-03-07 LAB — GENECONNECT MOLECULAR SCREEN: Genetic Analysis Overall Interpretation: NEGATIVE

## 2024-08-06 DIAGNOSIS — J209 Acute bronchitis, unspecified: Secondary | ICD-10-CM | POA: Diagnosis not present

## 2024-08-28 ENCOUNTER — Ambulatory Visit: Payer: Self-pay | Admitting: Internal Medicine

## 2024-08-28 ENCOUNTER — Encounter: Payer: Self-pay | Admitting: Internal Medicine

## 2024-08-28 ENCOUNTER — Ambulatory Visit: Admitting: Internal Medicine

## 2024-08-28 VITALS — BP 136/84 | HR 45 | Temp 98.1°F | Resp 16 | Ht 70.0 in | Wt 190.0 lb

## 2024-08-28 DIAGNOSIS — I10 Essential (primary) hypertension: Secondary | ICD-10-CM | POA: Diagnosis not present

## 2024-08-28 DIAGNOSIS — Z0001 Encounter for general adult medical examination with abnormal findings: Secondary | ICD-10-CM

## 2024-08-28 DIAGNOSIS — R001 Bradycardia, unspecified: Secondary | ICD-10-CM

## 2024-08-28 DIAGNOSIS — Z0000 Encounter for general adult medical examination without abnormal findings: Secondary | ICD-10-CM | POA: Diagnosis not present

## 2024-08-28 DIAGNOSIS — K219 Gastro-esophageal reflux disease without esophagitis: Secondary | ICD-10-CM | POA: Diagnosis not present

## 2024-08-28 DIAGNOSIS — E7801 Familial hypercholesterolemia: Secondary | ICD-10-CM | POA: Diagnosis not present

## 2024-08-28 DIAGNOSIS — Z23 Encounter for immunization: Secondary | ICD-10-CM | POA: Diagnosis not present

## 2024-08-28 LAB — PSA: PSA: 1.46 ng/mL (ref 0.10–4.00)

## 2024-08-28 LAB — HEPATIC FUNCTION PANEL
ALT: 22 U/L (ref 0–53)
AST: 21 U/L (ref 0–37)
Albumin: 4.1 g/dL (ref 3.5–5.2)
Alkaline Phosphatase: 52 U/L (ref 39–117)
Bilirubin, Direct: 0.1 mg/dL (ref 0.0–0.3)
Total Bilirubin: 0.5 mg/dL (ref 0.2–1.2)
Total Protein: 6.2 g/dL (ref 6.0–8.3)

## 2024-08-28 LAB — URINALYSIS, ROUTINE W REFLEX MICROSCOPIC
Bilirubin Urine: NEGATIVE
Hgb urine dipstick: NEGATIVE
Ketones, ur: NEGATIVE
Leukocytes,Ua: NEGATIVE
Nitrite: NEGATIVE
RBC / HPF: NONE SEEN (ref 0–?)
Specific Gravity, Urine: 1.015 (ref 1.000–1.030)
Total Protein, Urine: NEGATIVE
Urine Glucose: NEGATIVE
Urobilinogen, UA: 0.2 (ref 0.0–1.0)
WBC, UA: NONE SEEN (ref 0–?)
pH: 7 (ref 5.0–8.0)

## 2024-08-28 LAB — CBC WITH DIFFERENTIAL/PLATELET
Basophils Absolute: 0 K/uL (ref 0.0–0.1)
Basophils Relative: 0.9 % (ref 0.0–3.0)
Eosinophils Absolute: 0.1 K/uL (ref 0.0–0.7)
Eosinophils Relative: 1.8 % (ref 0.0–5.0)
HCT: 40.8 % (ref 39.0–52.0)
Hemoglobin: 14.1 g/dL (ref 13.0–17.0)
Lymphocytes Relative: 27.6 % (ref 12.0–46.0)
Lymphs Abs: 1.1 K/uL (ref 0.7–4.0)
MCHC: 34.7 g/dL (ref 30.0–36.0)
MCV: 89.8 fl (ref 78.0–100.0)
Monocytes Absolute: 0.4 K/uL (ref 0.1–1.0)
Monocytes Relative: 10.3 % (ref 3.0–12.0)
Neutro Abs: 2.4 K/uL (ref 1.4–7.7)
Neutrophils Relative %: 59.4 % (ref 43.0–77.0)
Platelets: 172 K/uL (ref 150.0–400.0)
RBC: 4.54 Mil/uL (ref 4.22–5.81)
RDW: 13.2 % (ref 11.5–15.5)
WBC: 4.1 K/uL (ref 4.0–10.5)

## 2024-08-28 LAB — LIPID PANEL
Cholesterol: 186 mg/dL (ref 0–200)
HDL: 47 mg/dL (ref >39.00)
LDL Cholesterol: 122 mg/dL — ABNORMAL HIGH (ref 0–99)
NonHDL: 138.83
Total CHOL/HDL Ratio: 4
Triglycerides: 85 mg/dL (ref 0.0–149.0)
VLDL: 17 mg/dL (ref 0.0–40.0)

## 2024-08-28 LAB — BASIC METABOLIC PANEL WITH GFR
BUN: 15 mg/dL (ref 6–23)
CO2: 27 meq/L (ref 19–32)
Calcium: 9.1 mg/dL (ref 8.4–10.5)
Chloride: 105 meq/L (ref 96–112)
Creatinine, Ser: 0.95 mg/dL (ref 0.40–1.50)
GFR: 93.87 mL/min (ref >60.00)
Glucose, Bld: 94 mg/dL (ref 70–99)
Potassium: 4.5 meq/L (ref 3.5–5.1)
Sodium: 139 meq/L (ref 135–145)

## 2024-08-28 LAB — TSH: TSH: 0.98 u[IU]/mL (ref 0.35–5.50)

## 2024-08-28 MED ORDER — OMEPRAZOLE 40 MG PO CPDR: 40.0000 mg | DELAYED_RELEASE_CAPSULE | Freq: Every day | ORAL | 1 refills | Status: AC

## 2024-08-28 MED ORDER — OLMESARTAN MEDOXOMIL 40 MG PO TABS
40.0000 mg | ORAL_TABLET | Freq: Every day | ORAL | 1 refills | Status: AC
Start: 2024-08-28 — End: ?

## 2024-08-28 NOTE — Patient Instructions (Addendum)
 You received your first Hepatitis B vaccine today. Please return for the second dose in one month.    Health Maintenance, Male Adopting a healthy lifestyle and getting preventive care are important in promoting health and wellness. Ask your health care provider about: The right schedule for you to have regular tests and exams. Things you can do on your own to prevent diseases and keep yourself healthy. What should I know about diet, weight, and exercise? Eat a healthy diet  Eat a diet that includes plenty of vegetables, fruits, low-fat dairy products, and lean protein. Do not eat a lot of foods that are high in solid fats, added sugars, or sodium. Maintain a healthy weight Body mass index (BMI) is a measurement that can be used to identify possible weight problems. It estimates body fat based on height and weight. Your health care provider can help determine your BMI and help you achieve or maintain a healthy weight. Get regular exercise Get regular exercise. This is one of the most important things you can do for your health. Most adults should: Exercise for at least 150 minutes each week. The exercise should increase your heart rate and make you sweat (moderate-intensity exercise). Do strengthening exercises at least twice a week. This is in addition to the moderate-intensity exercise. Spend less time sitting. Even light physical activity can be beneficial. Watch cholesterol and blood lipids Have your blood tested for lipids and cholesterol at 49 years of age, then have this test every 5 years. You may need to have your cholesterol levels checked more often if: Your lipid or cholesterol levels are high. You are older than 49 years of age. You are at high risk for heart disease. What should I know about cancer screening? Many types of cancers can be detected early and may often be prevented. Depending on your health history and family history, you may need to have cancer screening at  various ages. This may include screening for: Colorectal cancer. Prostate cancer. Skin cancer. Lung cancer. What should I know about heart disease, diabetes, and high blood pressure? Blood pressure and heart disease High blood pressure causes heart disease and increases the risk of stroke. This is more likely to develop in people who have high blood pressure readings or are overweight. Talk with your health care provider about your target blood pressure readings. Have your blood pressure checked: Every 3-5 years if you are 55-60 years of age. Every year if you are 8 years old or older. If you are between the ages of 76 and 58 and are a current or former smoker, ask your health care provider if you should have a one-time screening for abdominal aortic aneurysm (AAA). Diabetes Have regular diabetes screenings. This checks your fasting blood sugar level. Have the screening done: Once every three years after age 76 if you are at a normal weight and have a low risk for diabetes. More often and at a younger age if you are overweight or have a high risk for diabetes. What should I know about preventing infection? Hepatitis B If you have a higher risk for hepatitis B, you should be screened for this virus. Talk with your health care provider to find out if you are at risk for hepatitis B infection. Hepatitis C Blood testing is recommended for: Everyone born from [birth year] through 1966. Anyone with known risk factors for hepatitis C. Sexually transmitted infections (STIs) You should be screened each year for STIs, including gonorrhea and chlamydia, if: You are  sexually active and are younger than 49 years of age. You are older than 49 years of age and your health care provider tells you that you are at risk for this type of infection. Your sexual activity has changed since you were last screened, and you are at increased risk for chlamydia or gonorrhea. Ask your health care provider if you are at  risk. Ask your health care provider about whether you are at high risk for HIV. Your health care provider may recommend a prescription medicine to help prevent HIV infection. If you choose to take medicine to prevent HIV, you should first get tested for HIV. You should then be tested every 3 months for as long as you are taking the medicine. Follow these instructions at home: Alcohol use Do not drink alcohol if your health care provider tells you not to drink. If you drink alcohol: Limit how much you have to 0-2 drinks a day. Know how much alcohol is in your drink. In the [place] one drink equals one 12 oz bottle of beer (355 mL), one 5 oz glass of wine (148 mL), or one 1 oz glass of hard liquor (44 mL). Lifestyle Do not use any products that contain nicotine or tobacco. These products include cigarettes, chewing tobacco, and vaping devices, such as e-cigarettes. If you need help quitting, ask your health care provider. Do not use street drugs. Do not share needles. Ask your health care provider for help if you need support or information about quitting drugs. General instructions Schedule regular health, dental, and eye exams. Stay current with your vaccines. Tell your health care provider if: You often feel depressed. You have ever been abused or do not feel safe at home. Summary Adopting a healthy lifestyle and getting preventive care are important in promoting health and wellness. Follow your health care provider's instructions about healthy diet, exercising, and getting tested or screened for diseases. Follow your health care provider's instructions on monitoring your cholesterol and blood pressure. This information is not intended to replace advice given to you by your health care provider. Make sure you discuss any questions you have with your health care provider. Document Revised: 04/21/2021 Document Reviewed: 04/21/2021 Elsevier Patient Education  2024 [employer].

## 2024-08-28 NOTE — Progress Notes (Signed)
 Subjective:  Patient ID: Brandon [name], male    DOB: [birth year]  Age: 48 y.o. MRN: [id number]  CC: Hypertension, Annual Exam, and Gastroesophageal Reflux   HPI Brandon [name] presents for a CPX and f/up ---  Discussed the use of AI scribe software for clinical note transcription with the patient, who gave verbal consent to proceed.  History of Present Illness Brandon [name] is a 48 year old male who presents for an annual physical exam.  He engages in regular physical activity, cycling five to ten hours a week, without experiencing chest pain, shortness of breath, dizziness, or lightheadedness during these activities. He occasionally experiences dizziness when getting up too quickly from bed, which he attributes to age.  He has a history of bronchitis, during which he experienced chest pain and shortness of breath, but these symptoms have resolved.  He experiences heartburn or indigestion if he does not take his medication. No trouble swallowing or painful swallowing.  He has not yet received a flu shot this year but plans to get it soon, along with a COVID vaccine. His tetanus vaccination was last updated in July two years ago.     Outpatient Medications Prior to Visit  Medication Sig Dispense Refill   olmesartan  (BENICAR ) 40 MG tablet Take 1 tablet (40 mg total) by mouth daily. 90 tablet 1   omeprazole  (PRILOSEC) 40 MG capsule TAKE 1 CAPSULE BY MOUTH 2 TIMES DAILY BEFORE A MEAL 60 capsule 5   No facility-administered medications prior to visit.    ROS Review of Systems  Constitutional:  Negative for appetite change, chills, diaphoresis, fatigue and fever.  HENT: Negative.  Negative for sore throat and trouble swallowing.   Eyes: Negative.   Respiratory:  Negative for cough, chest tightness, shortness of breath and wheezing.   Cardiovascular:  Negative for chest pain, palpitations and leg swelling.  Gastrointestinal:  Negative for abdominal pain, constipation, diarrhea,  nausea and vomiting.  Endocrine: Negative.   Genitourinary: Negative.  Negative for difficulty urinating.  Musculoskeletal: Negative.   Skin: Negative.   Neurological: Negative.  Negative for dizziness, syncope, weakness and light-headedness.  Hematological:  Negative for adenopathy. Does not bruise/bleed easily.  Psychiatric/Behavioral: Negative.      Objective:  BP 136/84 (BP Location: Left Arm, Patient Position: Sitting, Cuff Size: Normal)   Pulse (!) 45   Temp 98.1 F (36.7 C) (Temporal)   Resp 16   Ht 5' 10 (1.778 m)   Wt 190 lb (86.2 kg)   SpO2 98%   BMI 27.26 kg/m   BP Readings from Last 3 Encounters:  08/28/24 136/84  02/23/24 136/86  08/26/23 (!) 150/88    Wt Readings from Last 3 Encounters:  08/28/24 190 lb (86.2 kg)  02/23/24 192 lb 9.6 oz (87.4 kg)  08/26/23 195 lb (88.5 kg)    Physical Exam Vitals reviewed.  Constitutional:      Appearance: Normal appearance.  HENT:     Mouth/Throat:     Mouth: Mucous membranes are moist.  Eyes:     General: No scleral icterus.    Conjunctiva/sclera: Conjunctivae normal.  Cardiovascular:     Rate and Rhythm: Bradycardia present.     Heart sounds: No murmur heard.    No friction rub. No gallop.     Comments: EKG--- SB, 51 bpm Antero/septal infarct pattern is not new Unchanged Pulmonary:     Effort: Pulmonary effort is normal.     Breath sounds: No stridor. No wheezing, rhonchi or rales.  Abdominal:  General: Abdomen is flat.     Palpations: There is no mass.     Tenderness: There is no abdominal tenderness. There is no guarding.     Hernia: No hernia is present.  Musculoskeletal:        General: Normal range of motion.     Cervical back: Neck supple.     Right lower leg: No edema.     Left lower leg: No edema.  Lymphadenopathy:     Cervical: No cervical adenopathy.  Skin:    General: Skin is warm and dry.  Neurological:     General: No focal deficit present.     Mental Status: He is alert. Mental  status is at baseline.  Psychiatric:        Mood and Affect: Mood normal.        Behavior: Behavior normal.     Lab Results  Component Value Date   WBC 4.1 08/28/2024   HGB 14.1 08/28/2024   HCT 40.8 08/28/2024   PLT 172.0 08/28/2024   GLUCOSE 94 08/28/2024   CHOL 186 08/28/2024   TRIG 85.0 08/28/2024   HDL 47.00 08/28/2024   LDLCALC 122 (H) 08/28/2024   ALT 22 08/28/2024   AST 21 08/28/2024   NA 139 08/28/2024   K 4.5 08/28/2024   CL 105 08/28/2024   CREATININE 0.95 08/28/2024   BUN 15 08/28/2024   CO2 27 08/28/2024   TSH 0.98 08/28/2024   PSA 1.46 08/28/2024    No results found.  Assessment & Plan:  Bradycardia, sinus -     TSH; Future -     EKG 12-Lead  Primary hypertension- BP is well controlled. EKG is negative for LVH. -     Basic metabolic panel with GFR; Future -     CBC with Differential/Platelet; Future -     Urinalysis, Routine w reflex microscopic; Future -     Hepatic function panel; Future -     EKG 12-Lead -     Olmesartan  Medoxomil; Take 1 tablet (40 mg total) by mouth daily.  Dispense: 90 tablet; Refill: 1  Encounter for general adult medical examination with abnormal findings- Exam completed, labs reviewed, vaccines reviewed and updated, cancer screenings addressed, pt ed material was given.  -     PSA; Future  Gastroesophageal reflux disease without esophagitis -     CBC with Differential/Platelet; Future -     Omeprazole ; Take 1 capsule (40 mg total) by mouth daily.  Dispense: 90 capsule; Refill: 1  LDL (low density lipoprotein receptor disorder)- Statin is not indicated. -     Lipid panel; Future  Need for vaccination -     Heplisav-B  (HepB-CPG) Vaccine     Follow-up: Return in about 6 months (around 02/25/2025).  Debby Molt, MD

## 2024-09-13 DIAGNOSIS — J3089 Other allergic rhinitis: Secondary | ICD-10-CM | POA: Diagnosis not present

## 2024-09-13 DIAGNOSIS — R052 Subacute cough: Secondary | ICD-10-CM | POA: Diagnosis not present

## 2024-09-13 DIAGNOSIS — K2100 Gastro-esophageal reflux disease with esophagitis, without bleeding: Secondary | ICD-10-CM | POA: Diagnosis not present

## 2024-09-27 DIAGNOSIS — R051 Acute cough: Secondary | ICD-10-CM | POA: Diagnosis not present

## 2024-10-03 DIAGNOSIS — J3089 Other allergic rhinitis: Secondary | ICD-10-CM | POA: Diagnosis not present

## 2024-10-03 DIAGNOSIS — R052 Subacute cough: Secondary | ICD-10-CM | POA: Diagnosis not present
